# Patient Record
Sex: Female | Born: 1980 | Hispanic: No | Marital: Married | Smoking: Never smoker
Health system: Southern US, Community
[De-identification: ages and names within clinical notes are randomized; demographics above are authoritative.]

## PROBLEM LIST (undated history)

## (undated) ENCOUNTER — Inpatient Hospital Stay (HOSPITAL_COMMUNITY): Payer: Self-pay

## (undated) DIAGNOSIS — Z789 Other specified health status: Secondary | ICD-10-CM

---

## 2010-07-25 ENCOUNTER — Encounter
Admission: RE | Admit: 2010-07-25 | Discharge: 2010-07-25 | Payer: Self-pay | Source: Home / Self Care | Attending: Infectious Diseases | Admitting: Infectious Diseases

## 2012-06-30 ENCOUNTER — Encounter (HOSPITAL_COMMUNITY): Payer: Self-pay | Admitting: *Deleted

## 2012-06-30 ENCOUNTER — Inpatient Hospital Stay (HOSPITAL_COMMUNITY)
Admission: AD | Admit: 2012-06-30 | Discharge: 2012-06-30 | Disposition: A | Discharge status: Home / Self Care | Payer: Medicaid Other | Source: Ambulatory Visit | Attending: Obstetrics & Gynecology | Admitting: Obstetrics & Gynecology

## 2012-06-30 DIAGNOSIS — J069 Acute upper respiratory infection, unspecified: Secondary | ICD-10-CM | POA: Insufficient documentation

## 2012-06-30 DIAGNOSIS — R509 Fever, unspecified: Secondary | ICD-10-CM | POA: Insufficient documentation

## 2012-06-30 DIAGNOSIS — J029 Acute pharyngitis, unspecified: Secondary | ICD-10-CM | POA: Insufficient documentation

## 2012-06-30 DIAGNOSIS — R51 Headache: Secondary | ICD-10-CM | POA: Insufficient documentation

## 2012-06-30 DIAGNOSIS — O99891 Other specified diseases and conditions complicating pregnancy: Secondary | ICD-10-CM | POA: Insufficient documentation

## 2012-06-30 HISTORY — DX: Other specified health status: Z78.9

## 2012-06-30 LAB — URINALYSIS, ROUTINE W REFLEX MICROSCOPIC
Ketones, ur: >80 mg/dL — AB
Leukocytes, UA: NEGATIVE
Nitrite: NEGATIVE
Specific Gravity, Urine: >1.03 — ABNORMAL HIGH (ref 1.005–1.030)
Urobilinogen, UA: 0.2 mg/dL (ref 0.0–1.0)
pH: 6.5 (ref 5.0–8.0)

## 2012-06-30 LAB — URINE MICROSCOPIC-ADD ON

## 2012-06-30 LAB — INFLUENZA PANEL BY PCR (TYPE A & B): Influenza B By PCR: NEGATIVE

## 2012-06-30 MED ORDER — DEXTROSE 5 % IN LACTATED RINGERS IV BOLUS
1000.0000 mL | Freq: Once | INTRAVENOUS | Status: AC
  Administered 2012-06-30: 1000 mL via INTRAVENOUS

## 2012-06-30 MED ORDER — MENTHOL 3 MG MT LOZG
1.0000 | LOZENGE | OROMUCOSAL | Status: DC | PRN
  Administered 2012-06-30: 3 mg via ORAL
  Filled 2012-06-30: qty 9

## 2012-06-30 MED ORDER — ACETAMINOPHEN 500 MG PO TABS
1000.0000 mg | ORAL_TABLET | ORAL | Status: AC
  Administered 2012-06-30: 1000 mg via ORAL
  Filled 2012-06-30: qty 2

## 2012-06-30 NOTE — Discharge Instructions (Signed)
You may safely take Tylenol, Chloraseptic spray or any brand of cough drops during pregnancy.  You can also use saline nasal spray and Sudafed as needed for congestion.    Upper Respiratory Infection, Adult An upper respiratory infection (URI) is also known as the common cold. It is often caused by a type of germ (virus). Colds are easily spread (contagious). You can pass it to others by kissing, coughing, sneezing, or drinking out of the same glass. Usually, you get better in 1 or 2 weeks.  HOME CARE   Only take medicine as told by your doctor.  Use a warm mist humidifier or breathe in steam from a hot shower.  Drink enough water and fluids to keep your pee (urine) clear or pale yellow.  Get plenty of rest.  Return to work when your temperature is back to normal or as told by your doctor. You may use a face mask and wash your hands to stop your cold from spreading. GET HELP RIGHT AWAY IF:   After the first few days, you feel you are getting worse.  You have questions about your medicine.  You have chills, shortness of breath, or brown or red spit (mucus).  You have yellow or brown snot (nasal discharge) or pain in the face, especially when you bend forward.  You have a fever, puffy (swollen) neck, pain when you swallow, or white spots in the back of your throat.  You have a bad headache, ear pain, sinus pain, or chest pain.  You have a high-pitched whistling sound when you breathe in and out (wheezing).  You have a lasting cough or cough up blood.  You have sore muscles or a stiff neck. MAKE SURE YOU:   Understand these instructions.  Will watch your condition.  Will get help right away if you are not doing well or get worse. Document Released: 12/04/2007 Document Revised: 09/09/2011 Document Reviewed: 10/22/2010 [facility] Patient Information 2013 [place], [place].  Pregnancy - Second Trimester The second trimester of pregnancy (3 to 6 months) is a period of rapid growth  for you and your baby. At the end of the sixth month, your baby is about 9 inches long and weighs 1 1/2 pounds. You will begin to feel the baby move between 18 and 20 weeks of the pregnancy. This is called quickening. Weight gain is faster. A clear fluid (colostrum) may leak out of your breasts. You may feel small contractions of the womb (uterus). This is known as false labor or Braxton-Hicks contractions. This is like a practice for labor when the baby is ready to be born. Usually, the problems with morning sickness have usually passed by the end of your first trimester. Some women develop small dark blotches (called cholasma, mask of pregnancy) on their face that usually goes away after the baby is born. Exposure to the sun makes the blotches worse. Acne may also develop in some pregnant women and pregnant women who have acne, may find that it goes away. PRENATAL EXAMS  Blood work may continue to be done during prenatal exams. These tests are done to check on your health and the probable health of your baby. Blood work is used to follow your blood levels (hemoglobin). Anemia (low hemoglobin) is common during pregnancy. Iron and vitamins are given to help prevent this. You will also be checked for diabetes between 24 and 28 weeks of the pregnancy. Some of the previous blood tests may be repeated.  The size of the uterus  is measured during each visit. This is to make sure that the baby is continuing to grow properly according to the dates of the pregnancy.  Your blood pressure is checked every prenatal visit. This is to make sure you are not getting toxemia.  Your urine is checked to make sure you do not have an infection, diabetes or protein in the urine.  Your weight is checked often to make sure gains are happening at the suggested rate. This is to ensure that both you and your baby are growing normally.  Sometimes, an ultrasound is performed to confirm the proper growth and development of the baby.  This is a test which bounces harmless sound waves off the baby so your caregiver can more accurately determine due dates. Sometimes, a specialized test is done on the amniotic fluid surrounding the baby. This test is called an amniocentesis. The amniotic fluid is obtained by sticking a needle into the belly (abdomen). This is done to check the chromosomes in instances where there is a concern about possible genetic problems with the baby. It is also sometimes done near the end of pregnancy if an early delivery is required. In this case, it is done to help make sure the baby's lungs are mature enough for the baby to live outside of the womb. CHANGES OCCURING IN THE SECOND TRIMESTER OF PREGNANCY Your body goes through many changes during pregnancy. They vary from person to person. Talk to your caregiver about changes you notice that you are concerned about.  During the second trimester, you will likely have an increase in your appetite. It is normal to have cravings for certain foods. This varies from person to person and pregnancy to pregnancy.  Your lower abdomen will begin to bulge.  You may have to urinate more often because the uterus and baby are pressing on your bladder. It is also common to get more bladder infections during pregnancy (pain with urination). You can help this by drinking lots of fluids and emptying your bladder before and after intercourse.  You may begin to get stretch marks on your hips, abdomen, and breasts. These are normal changes in the body during pregnancy. There are no exercises or medications to take that prevent this change.  You may begin to develop swollen and bulging veins (varicose veins) in your legs. Wearing support hose, elevating your feet for 15 minutes, 3 to 4 times a day and limiting salt in your diet helps lessen the problem.  Heartburn may develop as the uterus grows and pushes up against the stomach. Antacids recommended by your caregiver helps with this  problem. Also, eating smaller meals 4 to 5 times a day helps.  Constipation can be treated with a stool softener or adding bulk to your diet. Drinking lots of fluids, vegetables, fruits, and whole grains are helpful.  Exercising is also helpful. If you have been very active up until your pregnancy, most of these activities can be continued during your pregnancy. If you have been less active, it is helpful to start an exercise program such as walking.  Hemorrhoids (varicose veins in the rectum) may develop at the end of the second trimester. Warm sitz baths and hemorrhoid cream recommended by your caregiver helps hemorrhoid problems.  Backaches may develop during this time of your pregnancy. Avoid heavy lifting, wear low heal shoes and practice good posture to help with backache problems.  Some pregnant women develop tingling and numbness of their hand and fingers because of swelling and  tightening of ligaments in the wrist (carpel tunnel syndrome). This goes away after the baby is born.  As your breasts enlarge, you may have to get a bigger bra. Get a comfortable, cotton, support bra. Do not get a nursing bra until the last month of the pregnancy if you will be nursing the baby.  You may get a dark line from your belly button to the pubic area called the linea nigra.  You may develop rosy cheeks because of increase blood flow to the face.  You may develop spider looking lines of the face, neck, arms and chest. These go away after the baby is born. HOME CARE INSTRUCTIONS   It is extremely important to avoid all smoking, herbs, alcohol, and unprescribed drugs during your pregnancy. These chemicals affect the formation and growth of the baby. Avoid these chemicals throughout the pregnancy to ensure the delivery of a healthy infant.  Most of your home care instructions are the same as suggested for the first trimester of your pregnancy. Keep your caregiver's appointments. Follow your caregiver's  instructions regarding medication use, exercise and diet.  During pregnancy, you are providing food for you and your baby. Continue to eat regular, well-balanced meals. Choose foods such as meat, fish, milk and other low fat dairy products, vegetables, fruits, and whole-grain breads and cereals. Your caregiver will tell you of the ideal weight gain.  A physical sexual relationship may be continued up until near the end of pregnancy if there are no other problems. Problems could include early (premature) leaking of amniotic fluid from the membranes, vaginal bleeding, abdominal pain, or other medical or pregnancy problems.  Exercise regularly if there are no restrictions. Check with your caregiver if you are unsure of the safety of some of your exercises. The greatest weight gain will occur in the last 2 trimesters of pregnancy. Exercise will help you:  Control your weight.  Get you in shape for labor and delivery.  Lose weight after you have the baby.  Wear a good support or jogging bra for breast tenderness during pregnancy. This may help if worn during sleep. Pads or tissues may be used in the bra if you are leaking colostrum.  Do not use hot tubs, steam rooms or saunas throughout the pregnancy.  Wear your seat belt at all times when driving. This protects you and your baby if you are in an accident.  Avoid raw meat, uncooked cheese, cat litter boxes and soil used by cats. These carry germs that can cause birth defects in the baby.  The second trimester is also a good time to visit your dentist for your dental health if this has not been done yet. Getting your teeth cleaned is OK. Use a soft toothbrush. Brush gently during pregnancy.  It is easier to loose urine during pregnancy. Tightening up and strengthening the pelvic muscles will help with this problem. Practice stopping your urination while you are going to the bathroom. These are the same muscles you need to strengthen. It is also the  muscles you would use as if you were trying to stop from passing gas. You can practice tightening these muscles up 10 times a set and repeating this about 3 times per day. Once you know what muscles to tighten up, do not perform these exercises during urination. It is more likely to contribute to an infection by backing up the urine.  Ask for help if you have financial, counseling or nutritional needs during pregnancy. Your caregiver will  be able to offer counseling for these needs as well as refer you for other special needs.  Your skin may become oily. If so, wash your face with mild soap, use non-greasy moisturizer and oil or cream based makeup. MEDICATIONS AND DRUG USE IN PREGNANCY  Take prenatal vitamins as directed. The vitamin should contain 1 milligram of folic acid. Keep all vitamins out of reach of children. Only a couple vitamins or tablets containing iron may be fatal to a baby or young child when ingested.  Avoid use of all medications, including herbs, over-the-counter medications, not prescribed or suggested by your caregiver. Only take over-the-counter or prescription medicines for pain, discomfort, or fever as directed by your caregiver. Do not use aspirin.  Let your caregiver also know about herbs you may be using.  Alcohol is related to a number of birth defects. This includes fetal alcohol syndrome. All alcohol, in any form, should be avoided completely. Smoking will cause low birth rate and premature babies.  Street or illegal drugs are very harmful to the baby. They are absolutely forbidden. A baby born to an addicted mother will be addicted at birth. The baby will go through the same withdrawal an adult does. SEEK MEDICAL CARE IF:  You have any concerns or worries during your pregnancy. It is better to call with your questions if you feel they cannot wait, rather than worry about them. SEEK IMMEDIATE MEDICAL CARE IF:   An unexplained oral temperature above 102 F (38.9 C)  develops, or as your caregiver suggests.  You have leaking of fluid from the vagina (birth canal). If leaking membranes are suspected, take your temperature and tell your caregiver of this when you call.  There is vaginal spotting, bleeding, or passing clots. Tell your caregiver of the amount and how many pads are used. Light spotting in pregnancy is common, especially following intercourse.  You develop a bad smelling vaginal discharge with a change in the color from clear to white.  You continue to feel sick to your stomach (nauseated) and have no relief from remedies suggested. You vomit blood or coffee ground-like materials.  You lose more than 2 pounds of weight or gain more than 2 pounds of weight over 1 week, or as suggested by your caregiver.  You notice swelling of your face, hands, feet, or legs.  You get exposed to [place] measles and have never had them.  You are exposed to fifth disease or chickenpox.  You develop belly (abdominal) pain. Round ligament discomfort is a common non-cancerous (benign) cause of abdominal pain in pregnancy. Your caregiver still must evaluate you.  You develop a bad headache that does not go away.  You develop fever, diarrhea, pain with urination, or shortness of breath.  You develop visual problems, blurry, or double vision.  You fall or are in a car accident or any kind of trauma.  There is mental or physical violence at home. Document Released: 06/11/2001 Document Revised: 09/09/2011 Document Reviewed: 12/14/2008 [facility] Patient Information 2013 [place], [place].

## 2012-06-30 NOTE — MAU Note (Signed)
2 days has felt bad with fever, HA, sore throat. Concerned because she is pregnant.

## 2012-06-30 NOTE — MAU Provider Note (Signed)
Chief Complaint: HA, fever, sore throat    First Provider Initiated Contact with Patient 06/30/12 1027     SUBJECTIVE HPI: Ebony Smith is a 31 y.o. G2P1001 at [redacted]w[redacted]d by LMP who presents to maternity admissions reporting h/a, fever, and sore throat x2 days.  Pt s/o had similar symptoms 3 days ago.  She has not yet had care in this pregnancy but plans to go to the health department. She denies abdominal cramping, vaginal bleeding, vaginal itching/burning, urinary symptoms, h/a, dizziness, n/v, or fever/chills.     Past Medical History  Diagnosis Date  . No pertinent past medical history    Past Surgical History  Procedure Date  . Cesarean section    History   Social History  . Marital Status: Married    Spouse Name: N/A    Number of Children: N/A  . Years of Education: N/A   Occupational History  . Not on file.   Social History Main Topics  . Smoking status: Never Smoker   . Smokeless tobacco: Never Used  . Alcohol Use: No  . Drug Use: No  . Sexually Active:    Other Topics Concern  . Not on file   Social History Narrative  . No narrative on file   No current facility-administered medications on file prior to encounter.   No current outpatient prescriptions on file prior to encounter.   No Known Allergies  ROS: Pertinent items in HPI  OBJECTIVE Blood pressure 98/75, pulse 110, temperature 98.7 F (37.1 C), temperature source Oral, resp. rate 18, height 5' 0.5" (1.537 m), weight 57.063 kg (125 lb 12.8 oz), last menstrual period 04/09/2012, SpO2 99.00%. GENERAL: Well-developed, well-nourished female in no acute distress.  HEENT: Normocephalic HEART: normal rate RESP: normal effort ABDOMEN: Soft, non-tender EXTREMITIES: Nontender, no edema NEURO: Alert and oriented SPECULUM EXAM: Deferred  LAB RESULTS Results for orders placed during the hospital encounter of 06/30/12 (from the past 24 hour(s))  URINALYSIS, ROUTINE W REFLEX MICROSCOPIC     Status: Abnormal     Collection Time   06/30/12  8:15 AM      Component Value Range   Color, Urine YELLOW  YELLOW   APPearance CLEAR  CLEAR   Specific Gravity, Urine >1.030 (*) 1.005 - 1.030   pH 6.5  5.0 - 8.0   Glucose, UA NEGATIVE  NEGATIVE mg/dL   Hgb urine dipstick LARGE (*) NEGATIVE   Bilirubin Urine SMALL (*) NEGATIVE   Ketones, ur >80 (*) NEGATIVE mg/dL   Protein, ur 295 (*) NEGATIVE mg/dL   Urobilinogen, UA 0.2  0.0 - 1.0 mg/dL   Nitrite NEGATIVE  NEGATIVE   Leukocytes, UA NEGATIVE  NEGATIVE  URINE MICROSCOPIC-ADD ON     Status: Abnormal   Collection Time   06/30/12  8:15 AM      Component Value Range   Squamous Epithelial / LPF MANY (*) RARE   WBC, UA 0-2  <3 WBC/hpf   RBC / HPF 7-10  <3 RBC/hpf   Urine-Other MUCOUS PRESENT    POCT PREGNANCY, URINE     Status: Abnormal   Collection Time   06/30/12  8:25 AM      Component Value Range   Preg Test, Ur POSITIVE (*) NEGATIVE     ASSESSMENT 1. URI (upper respiratory infection)     PLAN Discharge home Flu swab pending, no fever, congestion, or body aches in MAU Pt may take OTC Tylenol, Chloraseptic spray, or any brand cough drops, and saline nasal spray or  Sudafed for congestion Drink more water F/U with HD as planned Return to MAU as needed   Sharen Counter Certified Nurse-Midwife 06/30/2012  10:38 AM

## 2012-07-01 NOTE — L&D Delivery Note (Signed)
Delivery Note At 7:11 AM a viable female was delivered via spontaneous Vaginal Birth after Cesarean Section (Presentation: Left Occiput Anterior) w/ loose shoulder/body cord, terminal meconium.  Infant placed on mom's abdomen w/o much resp effort or tone, tone improved and began to have spontaneous resp w/ vigorous drying, but still no good cry.  Cord clamped x 2 and cut, infant to warmer for further eval by nursing staff.  APGAR: 8, 8, nursery RN was called to evaluate baby by L&D nursing staff d/t some mild retractions/flaring; weight 6 lb 11.8 oz (3055 g).  Placenta status: Intact, Spontaneous.  Cord: 3 vessels with the following complications: None.  Cord pH: not done  Anesthesia: Epidural  Episiotomy: None Lacerations: Perineal;2nd degree (Rt sulcus/perineal) Suture Repair: 3.0 monocryl Est. Blood Loss (mL): 350  Mom to postpartum.  Baby to regular nursery to be observed. Breastfeeding, undecided about contraception  Marge Duncans 01/17/2013, 8:31 AM

## 2012-08-31 ENCOUNTER — Other Ambulatory Visit (HOSPITAL_COMMUNITY): Payer: Self-pay | Admitting: Nurse Practitioner

## 2012-08-31 LAB — OB RESULTS CONSOLE ABO/RH: RH Type: POSITIVE

## 2012-08-31 LAB — OB RESULTS CONSOLE HIV ANTIBODY (ROUTINE TESTING): HIV: NONREACTIVE

## 2012-08-31 LAB — OB RESULTS CONSOLE GC/CHLAMYDIA
Chlamydia: NEGATIVE
Gonorrhea: NEGATIVE

## 2012-08-31 LAB — OB RESULTS CONSOLE RPR: RPR: NONREACTIVE

## 2012-09-04 ENCOUNTER — Ambulatory Visit (HOSPITAL_COMMUNITY)
Admission: RE | Admit: 2012-09-04 | Discharge: 2012-09-04 | Disposition: A | Discharge status: Home / Self Care | Payer: Medicaid Other | Source: Ambulatory Visit | Attending: Nurse Practitioner | Admitting: Nurse Practitioner

## 2012-09-04 DIAGNOSIS — O358XX0 Maternal care for other (suspected) fetal abnormality and damage, not applicable or unspecified: Secondary | ICD-10-CM | POA: Insufficient documentation

## 2012-09-04 DIAGNOSIS — Z363 Encounter for antenatal screening for malformations: Secondary | ICD-10-CM | POA: Insufficient documentation

## 2012-11-19 ENCOUNTER — Encounter: Payer: Self-pay | Admitting: *Deleted

## 2012-12-23 LAB — OB RESULTS CONSOLE GBS: GBS: NEGATIVE

## 2013-01-15 ENCOUNTER — Encounter (HOSPITAL_COMMUNITY): Payer: Self-pay | Admitting: *Deleted

## 2013-01-15 ENCOUNTER — Inpatient Hospital Stay (HOSPITAL_COMMUNITY)
Admission: AD | Admit: 2013-01-15 | Discharge: 2013-01-15 | Disposition: A | Discharge status: Home / Self Care | Payer: Medicaid Other | Source: Ambulatory Visit | Attending: Obstetrics & Gynecology | Admitting: Obstetrics & Gynecology

## 2013-01-15 DIAGNOSIS — O479 False labor, unspecified: Secondary | ICD-10-CM

## 2013-01-15 HISTORY — DX: Other specified health status: Z78.9

## 2013-01-15 NOTE — MAU Note (Signed)
Has signed [place] consent from Health dept; had c/section with 1st pregnancy for failure to progress in [place];

## 2013-01-15 NOTE — MAU Note (Signed)
C/o ucs since 0500 this AM;no vaginal leaking;

## 2013-01-16 ENCOUNTER — Inpatient Hospital Stay (HOSPITAL_COMMUNITY)
Admission: AD | Admit: 2013-01-16 | Discharge: 2013-01-19 | DRG: 775 | Disposition: A | Discharge status: Home / Self Care | Payer: Medicaid Other | Source: Ambulatory Visit | Attending: Obstetrics & Gynecology | Admitting: Obstetrics & Gynecology

## 2013-01-16 ENCOUNTER — Encounter (HOSPITAL_COMMUNITY): Payer: Self-pay | Admitting: *Deleted

## 2013-01-16 ENCOUNTER — Encounter (HOSPITAL_COMMUNITY): Payer: Self-pay | Admitting: Anesthesiology

## 2013-01-16 ENCOUNTER — Inpatient Hospital Stay (HOSPITAL_COMMUNITY): Payer: Medicaid Other | Admitting: Anesthesiology

## 2013-01-16 DIAGNOSIS — O34219 Maternal care for unspecified type scar from previous cesarean delivery: Secondary | ICD-10-CM

## 2013-01-16 DIAGNOSIS — IMO0001 Reserved for inherently not codable concepts without codable children: Secondary | ICD-10-CM

## 2013-01-16 LAB — TYPE AND SCREEN: ABO/RH(D): O POS

## 2013-01-16 LAB — CBC
HCT: 39 % (ref 36.0–46.0)
Hemoglobin: 13.4 g/dL (ref 12.0–15.0)
MCHC: 34.4 g/dL (ref 30.0–36.0)
MCV: 85.2 fL (ref 78.0–100.0)

## 2013-01-16 MED ORDER — CITRIC ACID-SODIUM CITRATE 334-500 MG/5ML PO SOLN: 30.0000 mL | ORAL | Status: DC | PRN

## 2013-01-16 MED ORDER — ONDANSETRON HCL 4 MG/2ML IJ SOLN: 4.0000 mg | Freq: Four times a day (QID) | INTRAMUSCULAR | Status: DC | PRN

## 2013-01-16 MED ORDER — LIDOCAINE HCL (PF) 1 % IJ SOLN
30.0000 mL | INTRAMUSCULAR | Status: DC | PRN
  Filled 2013-01-16 (×2): qty 30

## 2013-01-16 MED ORDER — TERBUTALINE SULFATE 1 MG/ML IJ SOLN: 0.2500 mg | Freq: Once | INTRAMUSCULAR | Status: AC | PRN

## 2013-01-16 MED ORDER — OXYTOCIN 40 UNITS IN LACTATED RINGERS INFUSION - SIMPLE MED
62.5000 mL/h | INTRAVENOUS | Status: DC
  Filled 2013-01-16: qty 1000

## 2013-01-16 MED ORDER — LACTATED RINGERS IV SOLN
500.0000 mL | Freq: Once | INTRAVENOUS | Status: AC
  Administered 2013-01-16: 500 mL via INTRAVENOUS

## 2013-01-16 MED ORDER — LACTATED RINGERS IV SOLN
500.0000 mL | INTRAVENOUS | Status: DC | PRN
  Administered 2013-01-16: 500 mL via INTRAVENOUS

## 2013-01-16 MED ORDER — LIDOCAINE HCL (PF) 1 % IJ SOLN
INTRAMUSCULAR | Status: DC | PRN
  Administered 2013-01-16 (×4): 4 mL

## 2013-01-16 MED ORDER — DIPHENHYDRAMINE HCL 50 MG/ML IJ SOLN: 25.0000 mg | Freq: Once | INTRAMUSCULAR | Status: DC

## 2013-01-16 MED ORDER — OXYCODONE-ACETAMINOPHEN 5-325 MG PO TABS: 1.0000 | ORAL_TABLET | ORAL | Status: DC | PRN

## 2013-01-16 MED ORDER — OXYTOCIN 40 UNITS IN LACTATED RINGERS INFUSION - SIMPLE MED
1.0000 m[IU]/min | INTRAVENOUS | Status: DC
Start: 2013-01-16 — End: 2013-01-17
  Administered 2013-01-16: 2 m[IU]/min via INTRAVENOUS

## 2013-01-16 MED ORDER — EPHEDRINE 5 MG/ML INJ
10.0000 mg | INTRAVENOUS | Status: DC | PRN
  Filled 2013-01-16: qty 2
  Filled 2013-01-16: qty 4

## 2013-01-16 MED ORDER — FENTANYL CITRATE 0.05 MG/ML IJ SOLN
100.0000 ug | INTRAMUSCULAR | Status: DC | PRN
  Administered 2013-01-16 (×2): 100 ug via INTRAVENOUS
  Filled 2013-01-16 (×2): qty 2

## 2013-01-16 MED ORDER — DIPHENHYDRAMINE HCL 50 MG/ML IJ SOLN: 12.5000 mg | INTRAMUSCULAR | Status: DC | PRN

## 2013-01-16 MED ORDER — BUTORPHANOL TARTRATE 1 MG/ML IJ SOLN
1.0000 mg | INTRAMUSCULAR | Status: DC | PRN
  Administered 2013-01-16 (×3): 1 mg via INTRAVENOUS
  Filled 2013-01-16 (×3): qty 1

## 2013-01-16 MED ORDER — PHENYLEPHRINE 40 MCG/ML (10ML) SYRINGE FOR IV PUSH (FOR BLOOD PRESSURE SUPPORT)
80.0000 ug | PREFILLED_SYRINGE | INTRAVENOUS | Status: DC | PRN
  Filled 2013-01-16: qty 2

## 2013-01-16 MED ORDER — OXYTOCIN BOLUS FROM INFUSION
500.0000 mL | INTRAVENOUS | Status: DC
  Administered 2013-01-17: 500 mL via INTRAVENOUS

## 2013-01-16 MED ORDER — PHENYLEPHRINE 40 MCG/ML (10ML) SYRINGE FOR IV PUSH (FOR BLOOD PRESSURE SUPPORT)
80.0000 ug | PREFILLED_SYRINGE | INTRAVENOUS | Status: DC | PRN
  Filled 2013-01-16: qty 5
  Filled 2013-01-16: qty 2

## 2013-01-16 MED ORDER — EPHEDRINE 5 MG/ML INJ
10.0000 mg | INTRAVENOUS | Status: DC | PRN
  Filled 2013-01-16: qty 2

## 2013-01-16 MED ORDER — FENTANYL 2.5 MCG/ML BUPIVACAINE 1/10 % EPIDURAL INFUSION (WH - ANES)
14.0000 mL/h | INTRAMUSCULAR | Status: DC | PRN
  Administered 2013-01-16 – 2013-01-17 (×2): 14 mL/h via EPIDURAL
  Filled 2013-01-16 (×2): qty 125

## 2013-01-16 MED ORDER — IBUPROFEN 600 MG PO TABS: 600.0000 mg | ORAL_TABLET | Freq: Four times a day (QID) | ORAL | Status: DC | PRN

## 2013-01-16 MED ORDER — LACTATED RINGERS IV SOLN
INTRAVENOUS | Status: DC
  Administered 2013-01-16 (×2): via INTRAVENOUS

## 2013-01-16 MED ORDER — ACETAMINOPHEN 325 MG PO TABS: 650.0000 mg | ORAL_TABLET | ORAL | Status: DC | PRN

## 2013-01-16 NOTE — Anesthesia Preprocedure Evaluation (Signed)
Anesthesia Evaluation  Patient identified by MRN, date of birth, ID band Patient awake    Reviewed: Allergy & Precautions, H&P , NPO status , Patient's Chart, lab work & pertinent test results, reviewed documented beta blocker date and time   History of Anesthesia Complications Negative for: history of anesthetic complications  Airway Mallampati: I TM Distance: >3 FB Neck ROM: full    Dental  (+) Teeth Intact   Pulmonary neg pulmonary ROS,  breath sounds clear to auscultation        Cardiovascular negative cardio ROS  Rhythm:regular Rate:Normal     Neuro/Psych negative neurological ROS  negative psych ROS   GI/Hepatic negative GI ROS, Neg liver ROS,   Endo/Other  negative endocrine ROS  Renal/GU negative Renal ROS     Musculoskeletal   Abdominal   Peds  Hematology negative hematology ROS (+)   Anesthesia Other Findings   Reproductive/Obstetrics (+) Pregnancy (h/o c/s x1 for FTP (in [place]), attempting VBAC)                           Anesthesia Physical Anesthesia Plan  ASA: II  Anesthesia Plan: Epidural   Post-op Pain Management:    Induction:   Airway Management Planned:   Additional Equipment:   Intra-op Plan:   Post-operative Plan:   Informed Consent: I have reviewed the patients History and Physical, chart, labs and discussed the procedure including the risks, benefits and alternatives for the proposed anesthesia with the patient or authorized representative who has indicated his/her understanding and acceptance.     Plan Discussed with:   Anesthesia Plan Comments:         Anesthesia Quick Evaluation

## 2013-01-16 NOTE — H&P (Addendum)
Ebony Smith is a 32 y.o. female presenting for contractions. Pt has been having ctx since yesterday and presented to MAU for cervical exam. Now making change today1-3 today.. Maternal Medical History:  Reason for admission: Contractions.  Nausea. Cervical change - TOLAC  Contractions: Onset was yesterday.   Frequency: regular.      OB History   Grav Para Term Preterm Abortions TAB SAB Ect Mult Living   2 1 1  0 0 0 0 0 0 1     Past Medical History  Diagnosis Date  . No pertinent past medical history   . Medical history non-contributory    Past Surgical History  Procedure Laterality Date  . Cesarean section    Prior section for failure to progress (Stalled at 6cm)  Family History: family history is negative for Alcohol abuse, and Asthma, and Arthritis, and Birth defects, and Cancer, and COPD, and Depression, and Diabetes, and Drug abuse, and Early death, and Hearing loss, and Heart disease, and Hyperlipidemia, and Hypertension, and Kidney disease, and Learning disabilities, and Mental illness, and Mental retardation, and Miscarriages / Stillbirths, and Stroke, and Vision loss, . Social History:  reports that she has never smoked. She has never used smokeless tobacco. She reports that she does not drink alcohol or use illicit drugs.  Review of Systems  Constitutional: Negative for fever, chills and diaphoresis.  Gastrointestinal: Positive for abdominal pain (ctx). Negative for nausea and vomiting.  Neurological: Negative for weakness and headaches.    Dilation: 3 Effacement (%): 90 Station: -1;-2 Exam by:: K.Wilson,RN Blood pressure 114/71, pulse 97, temperature 98.3 F (36.8 C), temperature source Oral, resp. rate 22, weight 67.223 kg (148 lb 3.2 oz), last menstrual period 04/09/2012. Maternal Exam:  Uterine Assessment: Contraction strength is firm.  Contraction duration is 60 seconds. Contraction frequency is regular.   Abdomen: Surgical scars: low transverse.   Fundal  height is appropriate.   Estimated fetal weight is leopold: 3000g.   Fetal presentation: vertex  Cervix: Cervix evaluated by digital exam.   performated by MAU nurse     Fetal Exam Fetal Monitor Review: Mode: ultrasound.   Baseline rate: 130s.  Variability: moderate (6-25 bpm).   Pattern: accelerations present and no decelerations.    Fetal State Assessment: Category I - tracings are normal.     Physical Exam  Constitutional: She is oriented to person, place, and time. She appears well-developed and well-nourished. She appears distressed.  In labor  HENT:  Head: Normocephalic and atraumatic.  Cardiovascular: Normal rate, regular rhythm and normal heart sounds.  Exam reveals no gallop and no friction rub.   No murmur heard. Respiratory: Effort normal and breath sounds normal. No respiratory distress. She has no wheezes. She has no rales. She exhibits no tenderness.  GI: She exhibits no distension (gravid). There is no tenderness. There is no rebound.  Neurological: She is alert and oriented to person, place, and time. She has normal reflexes.  Skin: She is not diaphoretic.  Psychiatric: She has a normal mood and affect. Her behavior is normal. Judgment and thought content normal.    Prenatal labs: ABO, Rh: O/Positive/-- (03/03 0000) Antibody:   Rubella: Immune (03/03 0000) RPR: Nonreactive (03/03 0000)  HBsAg: Negative (03/03 0000)  HIV: Non-reactive (03/03 0000)  GBS: Negative (06/25 0000)   Assessment/Plan: 32 y.o. F  [name] presents in early labor with painful ctx. Will admit for close monitoring of TOLAC. Consent verified.  #labor: No interventions at this time. Early/latent labor monitor for  TOLAC (Failure to progress) #FWB: continous monitor. obs for changes of ctx pattern #Pain: Pt desires pain meds and epidural #ID: [id number] TDAP, GBS neg  #MOC: Undecided, consented for Tubal #MOF: Breast  Cornell Gaber, RYAN 01/16/2013, 9:26 AM

## 2013-01-16 NOTE — Progress Notes (Signed)
Ebony Smith is a 32 y.o. G2P1001 at [redacted]w[redacted]d admitted for early labor and TOLAC  Subjective: Pt with minimal change over last 3hrs. Rec'd fentanyl x2 and has helped with pain but still not well controlled.  Objective: BP 122/76  Pulse 93  Temp(Src) 98.3 F (36.8 C) (Oral)  Resp 18  Ht 5' 0.51" (1.537 m)  Wt 67.223 kg (148 lb 3.2 oz)  BMI 28.46 kg/m2  LMP 04/09/2012      FHT:  FHR: 125 bpm, variability: moderate,  accelerations:  Present,  decelerations:  Absent UC:   regular, every 3-5 minutes SVE:   Dilation: 3 Effacement (%): 100 Station: -1 Exam by:: hk  Labs: Lab Results  Component Value Date   WBC 24.7* 01/16/2013   HGB 13.4 01/16/2013   HCT 39.0 01/16/2013   MCV 85.2 01/16/2013   PLT 262 01/16/2013    Assessment / Plan: Ebony Smith is a 32 y.o. G2P1001 at [redacted]w[redacted]d admitted for early labor and TOLAC #Labor: TOLAC - latent labor currently #Pain: Moderate control with fentanyl. Being in early labor will trial stadol for longer acting control #FWB: Cat I: reactive and reassuring #ID: rec'd Flu/Tdap #Anticipated MOD: NSVD  Ebony Smith, RYAN 01/16/2013, 1:14 PM

## 2013-01-16 NOTE — Progress Notes (Signed)
Ebony Smith is a 32 y.o. G2P1001 at [redacted]w[redacted]d admitted for early labor and TOLAC  Subjective: Pt with minimal change over last 3hrs. Pt rec'd stadol with improved pain control.  Objective: BP 125/78  Pulse 92  Temp(Src) 98 F (36.7 C) (Oral)  Resp 18  Ht 5' 0.51" (1.537 m)  Wt 67.223 kg (148 lb 3.2 oz)  BMI 28.46 kg/m2  LMP 04/09/2012      FHT:  FHR: 130 bpm, variability: moderate,  accelerations:  Present,  decelerations:  variable UC:   regular, every 3-5 minutes SVE:   Dilation: 3 Effacement (%): 100 Station: -1 Exam by:: hk 3/80/-2 by my exam Labs: Lab Results  Component Value Date   WBC 24.7* 01/16/2013   HGB 13.4 01/16/2013   HCT 39.0 01/16/2013   MCV 85.2 01/16/2013   PLT 262 01/16/2013    Assessment / Plan: Ebony Smith is a 32 y.o. G2P1001 at [redacted]w[redacted]d admitted for early labor and TOLAC #Labor: TOLAC - latent labor currently, will augment with pitocin 2x51mU #Pain: continue stadol for longer acting control #FWB: Cat II: reactive and reassuring #ID: rec'd Flu/Tdap #Anticipated MOD: NSVD  Gerardo Caiazzo, RYAN 01/16/2013, 3:39 PM

## 2013-01-16 NOTE — Progress Notes (Signed)
Ebony Smith is a 32 y.o. G2P1001 at [redacted]w[redacted]d admitted for early labor, TOLAC @ [redacted]w[redacted]d   Subjective: Uncomfortable w/ uc's, breathing well, stadol helping  Objective: BP 128/80  Pulse 85  Temp(Src) 97.9 F (36.6 C) (Oral)  Resp 16  Ht 5' 0.51" (1.537 m)  Wt 67.223 kg (148 lb 3.2 oz)  BMI 28.46 kg/m2  LMP 04/09/2012      FHT:  FHR: 140 bpm, variability: moderate,  accelerations:  Present,  decelerations:  Present occasional variable UC:   regular, every 2-4 minutes SVE:   Dilation: 4 Effacement (%): 100 Station: -1 Exam by:: Raesha Coonrod CNM vtx well applied to cx, comes down nicely w/ uc, +bloody show AROM small amount blood-tinged fluid  Labs: Lab Results  Component Value Date   WBC 24.7* 01/16/2013   HGB 13.4 01/16/2013   HCT 39.0 01/16/2013   MCV 85.2 01/16/2013   PLT 262 01/16/2013    Assessment / Plan: augmentation of labor w/ low-dose pitocin @ 105mu/min, has changed to 4cm, discussed arom- pt wishes to proceed  Labor: Progressing normally Preeclampsia:  n/a Fetal Wellbeing:  Category II Pain Control:  stadol iv I/D:  n/a Anticipated MOD:  VBAC  Marge Duncans 01/16/2013, 7:31 PM

## 2013-01-16 NOTE — Progress Notes (Signed)
Ebony Smith is a 32 y.o. G2P1001 at [redacted]w[redacted]d admitted for early labor/TOLAC  Subjective: Uncomfortable w/ uc's, feeling lots of pressure, involuntarily pushing, desires epidural  Objective: BP 130/64  Pulse 80  Temp(Src) 98.5 F (36.9 C) (Oral)  Resp 18  Ht 5' 0.51" (1.537 m)  Wt 67.223 kg (148 lb 3.2 oz)  BMI 28.46 kg/m2  LMP 04/09/2012      FHT:  FHR: 135 bpm, variability: moderate,  accelerations:  Abscent,  decelerations:  Present variables UC:   regular, every 2-3 minutes SVE:   Dilation: 6 Effacement (%): 100 Station: -1 Exam by:: AL rinehart RN  Labs: Lab Results  Component Value Date   WBC 24.7* 01/16/2013   HGB 13.4 01/16/2013   HCT 39.0 01/16/2013   MCV 85.2 01/16/2013   PLT 262 01/16/2013    Assessment / Plan: Augmentation of labor, progressing well, pitocin @ 46mu/min  Labor: Progressing normally Preeclampsia:  n/a Fetal Wellbeing:  Category II Pain Control:  stadol, requesting epidural I/D:  n/a Anticipated MOD:  VBAC  Marge Duncans 01/16/2013, 10:02 PM

## 2013-01-16 NOTE — MAU Note (Signed)
Was here yesterday and sent home. Ctxs closer and stronger. Some bloody show. TOLAC

## 2013-01-16 NOTE — Anesthesia Procedure Notes (Addendum)
Epidural Patient location during procedure: OB Start time: 01/16/2013 10:17 PM  Staffing Performed by: anesthesiologist   Preanesthetic Checklist Completed: patient identified, site marked, surgical consent, pre-op evaluation, timeout performed, IV checked, risks and benefits discussed and monitors and equipment checked  Epidural Patient position: sitting Prep: site prepped and draped and DuraPrep Patient monitoring: continuous pulse ox and blood pressure Approach: midline Injection technique: LOR air  Needle:  Needle type: Tuohy  Needle gauge: 17 G Needle length: 9 cm and 9 Needle insertion depth: 5.5 cm Catheter type: closed end flexible Catheter size: 19 Gauge Catheter at skin depth: 9.5 cm Test dose: negative  Assessment Events: blood aspirated, injection not painful, no injection resistance, negative IV test and no paresthesia  Additional Notes Discussed risk of headache, infection, bleeding, nerve injury and failed or incomplete block.  Patient voices understanding and wishes to proceed.  Epidural placed easily on first attempt.  No paresthesia.  +heme in catheter after placement - catheter withdrawn one centimeter to 9.5 cm at skin and - aspirate.  Test dosing negative.  Patient tolerated procedure well.  Jasmine December, MDReason for block:procedure for pain

## 2013-01-17 ENCOUNTER — Encounter (HOSPITAL_COMMUNITY): Payer: Self-pay | Admitting: *Deleted

## 2013-01-17 DIAGNOSIS — O34219 Maternal care for unspecified type scar from previous cesarean delivery: Secondary | ICD-10-CM

## 2013-01-17 MED ORDER — TETANUS-DIPHTH-ACELL PERTUSSIS 5-2.5-18.5 LF-MCG/0.5 IM SUSP: 0.5000 mL | Freq: Once | INTRAMUSCULAR | Status: DC

## 2013-01-17 MED ORDER — FLEET ENEMA 7-19 GM/118ML RE ENEM: 1.0000 | ENEMA | Freq: Every day | RECTAL | Status: DC | PRN

## 2013-01-17 MED ORDER — ONDANSETRON HCL 4 MG PO TABS: 4.0000 mg | ORAL_TABLET | ORAL | Status: DC | PRN

## 2013-01-17 MED ORDER — ONDANSETRON HCL 4 MG/2ML IJ SOLN: 4.0000 mg | INTRAMUSCULAR | Status: DC | PRN

## 2013-01-17 MED ORDER — MEASLES, MUMPS & RUBELLA VAC ~~LOC~~ INJ: 0.5000 mL | INJECTION | Freq: Once | SUBCUTANEOUS | Status: DC

## 2013-01-17 MED ORDER — IBUPROFEN 600 MG PO TABS
600.0000 mg | ORAL_TABLET | Freq: Four times a day (QID) | ORAL | Status: DC
  Administered 2013-01-17 – 2013-01-19 (×7): 600 mg via ORAL
  Filled 2013-01-17 (×8): qty 1

## 2013-01-17 MED ORDER — BISACODYL 10 MG RE SUPP: 10.0000 mg | Freq: Every day | RECTAL | Status: DC | PRN

## 2013-01-17 MED ORDER — BENZOCAINE-MENTHOL 20-0.5 % EX AERO
1.0000 | INHALATION_SPRAY | CUTANEOUS | Status: DC | PRN
Start: 2013-01-17 — End: 2013-01-19
  Administered 2013-01-17: 1 via TOPICAL
  Filled 2013-01-17: qty 56

## 2013-01-17 MED ORDER — PRENATAL MULTIVITAMIN CH
1.0000 | ORAL_TABLET | Freq: Every day | ORAL | Status: DC
  Administered 2013-01-18: 1 via ORAL
  Filled 2013-01-17: qty 1

## 2013-01-17 MED ORDER — DIBUCAINE 1 % RE OINT: 1.0000 "application " | TOPICAL_OINTMENT | RECTAL | Status: DC | PRN

## 2013-01-17 MED ORDER — OXYTOCIN 40 UNITS IN LACTATED RINGERS INFUSION - SIMPLE MED: 62.5000 mL/h | INTRAVENOUS | Status: DC | PRN

## 2013-01-17 MED ORDER — SODIUM CHLORIDE 0.9 % IJ SOLN: 3.0000 mL | Freq: Two times a day (BID) | INTRAMUSCULAR | Status: DC

## 2013-01-17 MED ORDER — OXYCODONE-ACETAMINOPHEN 5-325 MG PO TABS
1.0000 | ORAL_TABLET | ORAL | Status: DC | PRN
  Administered 2013-01-17 – 2013-01-18 (×2): 1 via ORAL
  Filled 2013-01-17 (×2): qty 1

## 2013-01-17 MED ORDER — ZOLPIDEM TARTRATE 5 MG PO TABS: 5.0000 mg | ORAL_TABLET | Freq: Every evening | ORAL | Status: DC | PRN

## 2013-01-17 MED ORDER — DIPHENHYDRAMINE HCL 25 MG PO CAPS: 25.0000 mg | ORAL_CAPSULE | Freq: Four times a day (QID) | ORAL | Status: DC | PRN

## 2013-01-17 MED ORDER — SODIUM CHLORIDE 0.9 % IJ SOLN: 3.0000 mL | INTRAMUSCULAR | Status: DC | PRN

## 2013-01-17 MED ORDER — LANOLIN HYDROUS EX OINT: TOPICAL_OINTMENT | CUTANEOUS | Status: DC | PRN

## 2013-01-17 MED ORDER — SENNOSIDES-DOCUSATE SODIUM 8.6-50 MG PO TABS
2.0000 | ORAL_TABLET | Freq: Every day | ORAL | Status: DC
  Administered 2013-01-17: 2 via ORAL

## 2013-01-17 MED ORDER — WITCH HAZEL-GLYCERIN EX PADS: 1.0000 "application " | MEDICATED_PAD | CUTANEOUS | Status: DC | PRN

## 2013-01-17 MED ORDER — SIMETHICONE 80 MG PO CHEW: 80.0000 mg | CHEWABLE_TABLET | ORAL | Status: DC | PRN

## 2013-01-17 MED ORDER — SODIUM CHLORIDE 0.9 % IV SOLN: 250.0000 mL | INTRAVENOUS | Status: DC | PRN

## 2013-01-17 NOTE — Lactation Note (Signed)
This note was copied from the chart of Ebony Chrys [place]. Lactation Consultation Note: Initial visit with mom who intends to breast feed per note on admission on 01/17/13 at 0400. Experienced BF mom reports that baby nursed well. Baby asleep in bassinet and mom resting. No questions at present. BF brochure given with resources for support after DC. To call for assist prn,  Patient Name: Ebony Smith [id number] Date: 01/17/2013 Reason for consult: Initial assessment   Maternal Data Formula Feeding for Exclusion: No Does the patient have breastfeeding experience prior to this delivery?: Yes  Feeding   LATCH Score/Interventions      Lactation Tools Discussed/Used     Consult Status Consult Status: PRN    Pamelia Hoit 01/17/2013, 2:28 PM

## 2013-01-17 NOTE — Progress Notes (Signed)
Ebony Smith is a 32 y.o. G2P1001 at [redacted]w[redacted]d admitted for early labor, TOLAC  Subjective: Began feeling strong urge to push ~ 0500 and began pushing  Objective: BP 138/67  Pulse 106  Temp(Src) 98.5 F (36.9 C) (Oral)  Resp 18  Ht 5' 0.51" (1.537 m)  Wt 67.223 kg (148 lb 3.2 oz)  BMI 28.46 kg/m2  SpO2 98%  LMP 04/09/2012      FHT:  FHR: 145 bpm, variability: moderate,  accelerations:  Present,  decelerations:  Present variables w/ pushing UC:   regular, every 2-4 minutes SVE:   Dilation: 10 Effacement (%): 100 Station: +1 Exam by:: AL Rinehart RN  Began pushing @ ~0500, pushing well, seeing about 4cm of head w/ pushing  Labs: Lab Results  Component Value Date   WBC 24.7* 01/16/2013   HGB 13.4 01/16/2013   HCT 39.0 01/16/2013   MCV 85.2 01/16/2013   PLT 262 01/16/2013    Assessment / Plan: Augmentation of labor, progressing well, now pushing  Labor: Progressing normally Preeclampsia:  n/a Fetal Wellbeing:  Category II Pain Control:  Epidural I/D:  n/a Anticipated MOD:  VBAC  Marge Duncans 01/17/2013, 6:02 AM

## 2013-01-17 NOTE — Progress Notes (Signed)
Ebony Smith is a 32 y.o. G2P1001 at [redacted]w[redacted]d admitted for early labor/TOLAC  Subjective: Comfortable w/ uc's, w/o strong urge to push  Objective: BP 121/69  Pulse 92  Temp(Src) 98.3 F (36.8 C) (Axillary)  Resp 18  Ht 5' 0.51" (1.537 m)  Wt 67.223 kg (148 lb 3.2 oz)  BMI 28.46 kg/m2  SpO2 98%  LMP 04/09/2012      FHT:  FHR: 140 bpm, variability: moderate,  accelerations:  Present,  decelerations:  Present variables w/ pushing UC:   regular, every 2-3 minutes SVE:   Dilation: 10 Effacement (%): 100 Station: +1 Exam by:: AL Rinehart RN  Labs: Lab Results  Component Value Date   WBC 24.7* 01/16/2013   HGB 13.4 01/16/2013   HCT 39.0 01/16/2013   MCV 85.2 01/16/2013   PLT 262 01/16/2013    Assessment / Plan: Augmentation of labor, progressing well, RN had begun pushing w/ pt app ago, no change in station of vertex, pt w/o strong urge to push. Will labor down  Labor: Progressing normally Preeclampsia:  n/a Fetal Wellbeing:  Category II Pain Control:  Epidural I/D:  n/a Anticipated MOD:  VBAC  Marge Duncans 01/17/2013, 2:58 AM

## 2013-01-17 NOTE — Anesthesia Postprocedure Evaluation (Signed)
  Anesthesia Post-op Note  Patient: Ebony Smith  Procedure(s) Performed: * No procedures listed *  Patient Location: Mother/Baby  Anesthesia Type:Epidural  Level of Consciousness: awake  Airway and Oxygen Therapy: Patient Spontanous Breathing  Post-op Pain: mild  Post-op Assessment: Patient's Cardiovascular Status Stable and Respiratory Function Stable  Post-op Vital Signs: stable  Complications: No apparent anesthesia complications

## 2013-01-18 NOTE — Progress Notes (Signed)
Post Partum Day 1 Subjective: no complaints, up ad lib, voiding, tolerating PO and + flatus  Objective: Blood pressure 99/62, pulse 76, temperature 97.6 F (36.4 C), temperature source Oral, resp. rate 17, height 5' 0.51" (1.537 m), weight 148 lb 3.2 oz (67.223 kg), last menstrual period 04/09/2012, SpO2 98.00%, unknown if currently breastfeeding.  Physical Exam:  General: alert, cooperative, appears stated age and no distress Lochia: appropriate Uterine Fundus: firm Incision: n/a DVT Evaluation: No evidence of DVT seen on physical exam. Negative Homan's sign.   Recent Labs  01/16/13 0955  HGB 13.4  HCT 39.0    Assessment/Plan: Plan for discharge tomorrow   LOS: 2 days   Ebony Smith 01/18/2013, 7:25 AM

## 2013-01-18 NOTE — Progress Notes (Signed)
UR chart review completed.  

## 2013-01-19 DIAGNOSIS — O34219 Maternal care for unspecified type scar from previous cesarean delivery: Secondary | ICD-10-CM

## 2013-01-19 MED ORDER — IBUPROFEN 600 MG PO TABS: 600.0000 mg | ORAL_TABLET | Freq: Four times a day (QID) | ORAL | Status: DC

## 2013-01-19 MED ORDER — SENNOSIDES-DOCUSATE SODIUM 8.6-50 MG PO TABS: 2.0000 | ORAL_TABLET | Freq: Every day | ORAL | Status: DC

## 2013-01-19 NOTE — Discharge Summary (Signed)
Obstetric Discharge Summary Reason for Admission: onset of labor Prenatal Procedures: none Intrapartum Procedures: spontaneous vaginal delivery (VBAC) Postpartum Procedures: none Complications-Operative and Postpartum: none  Patient is breast and bottle-feeding and is undecided regarding contraception.  Hemoglobin  Date Value Range Status  01/16/2013 13.4  12.0 - 15.0 g/dL Final     HCT  Date Value Range Status  01/16/2013 39.0  36.0 - 46.0 % Final    Physical Exam:  General: alert, cooperative and no distress Lochia: appropriate Uterine Fundus: firm Incision: n/a DVT Evaluation: No evidence of DVT seen on physical exam. Negative Homan's sign. No cords or calf tenderness. No significant calf/ankle edema.  Discharge Diagnoses: Term Pregnancy-delivered, successful VBAC  Discharge Information: Date: 01/19/2013 Activity: pelvic rest Diet: routine Medications: PNV, Ibuprofen and Colace Condition: stable Instructions: refer to practice specific booklet Discharge to: home  Follow-up Information   Follow up with [facility] [place]. Schedule an appointment as soon as possible for a visit in 6 weeks. (For postpartum visit)    Contact information:   [address] [place] [address] [phone]      Newborn Data: Live born female  Birth Weight: 6 lb 11.8 oz (3055 g) APGAR: 8, 8  Home with mother.  Napoleon Form 01/19/2013, 7:35 AM

## 2014-03-04 IMAGING — US US OB DETAIL+14 WK
1 series · 12 of 28 positions shown · non-contrast
Comparison: none

[Series 1: us ob detail +14 wk · 12 of 97 slices shown]
[im 4/97]
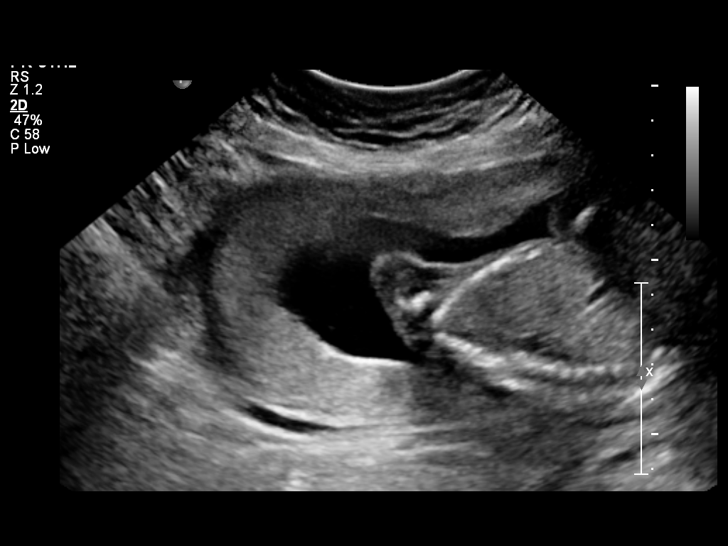
[im 11/97]
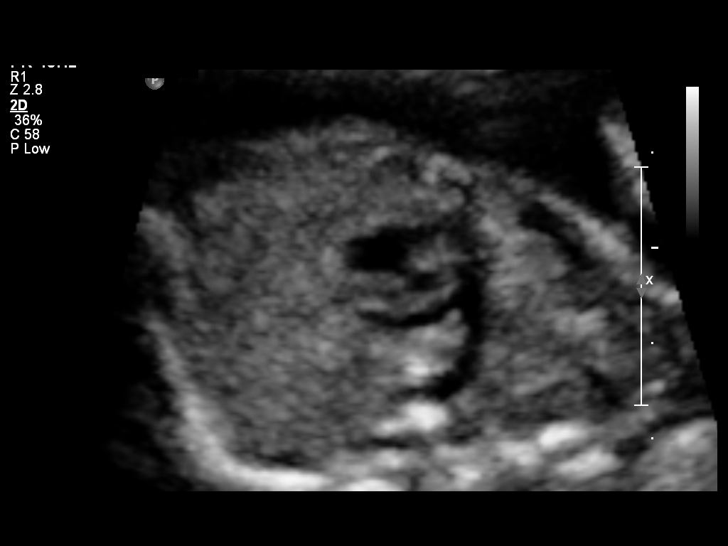
[im 18/97]
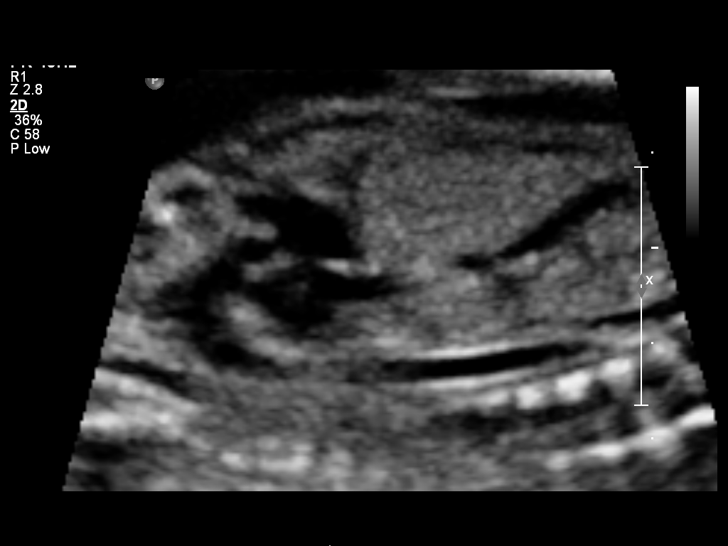
[im 29/97]
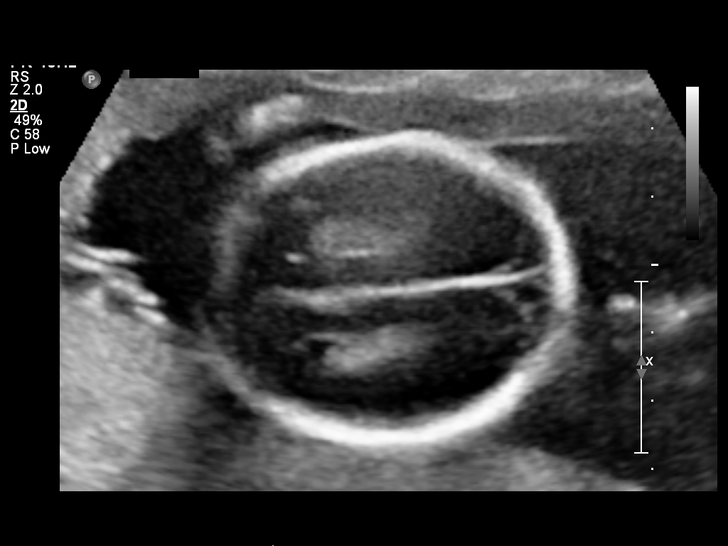
[im 36/97]
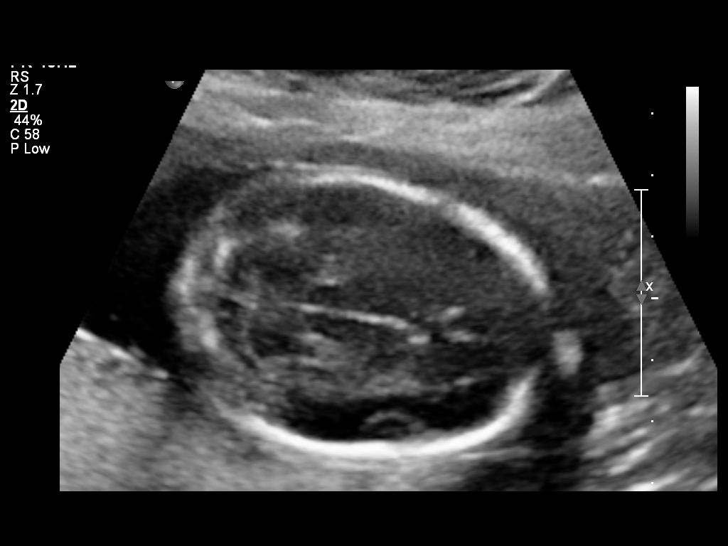
[im 43/97]
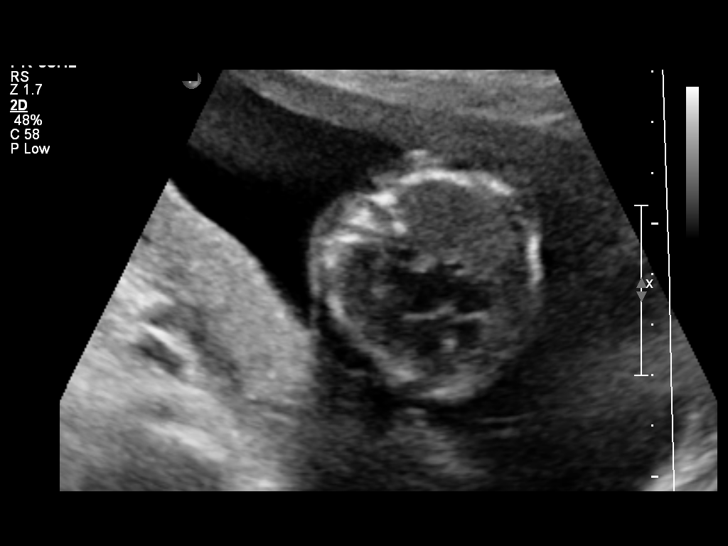
[im 54/97]
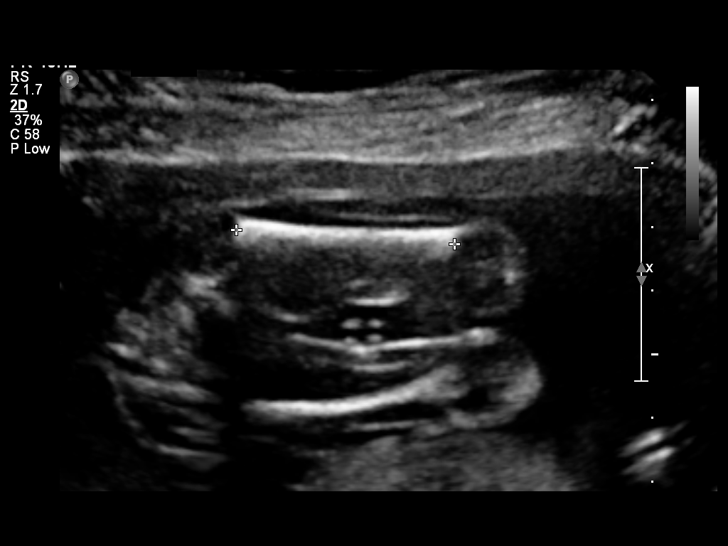
[im 61/97]
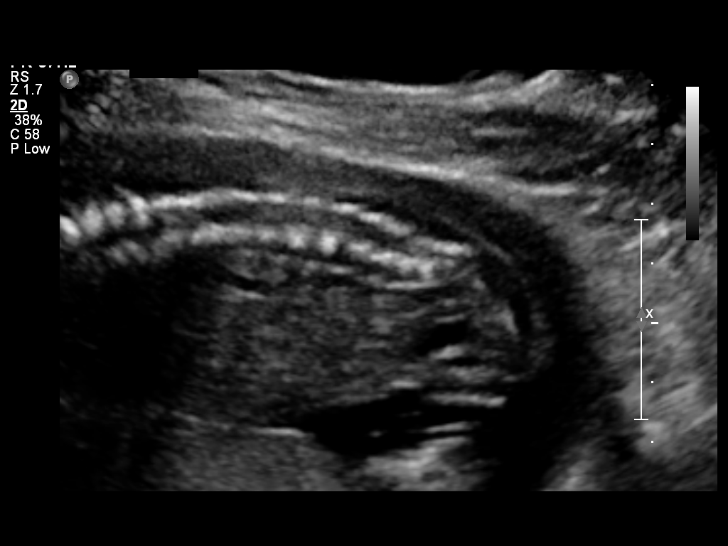
[im 68/97]
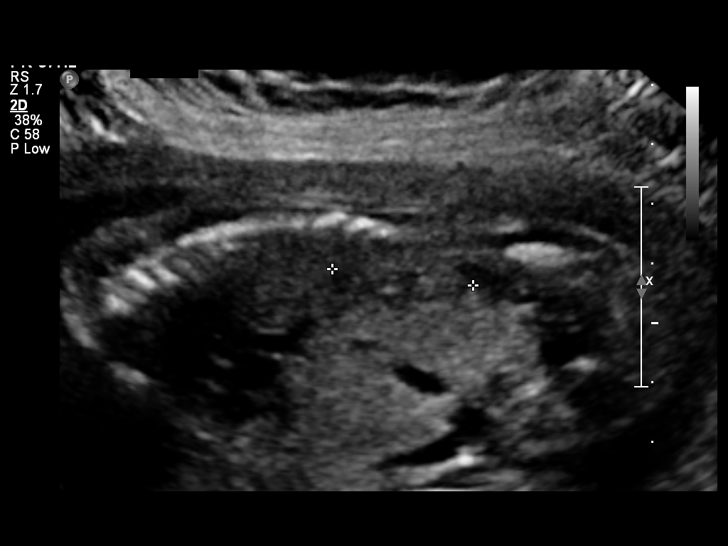
[im 79/97]
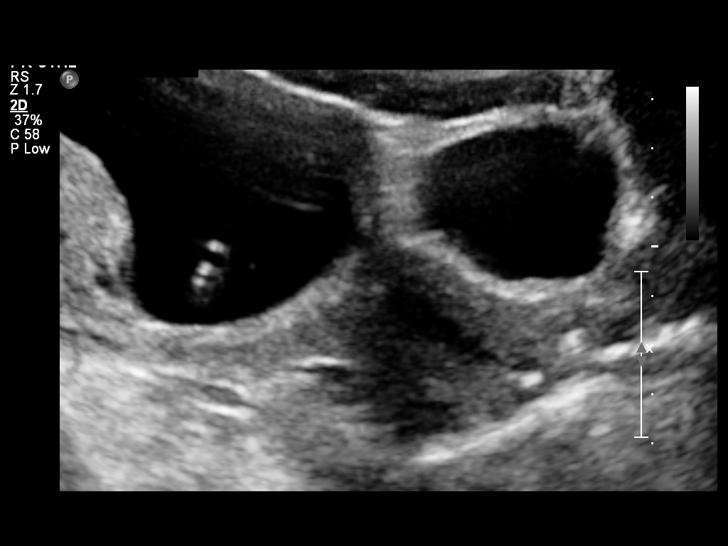
[im 86/97]
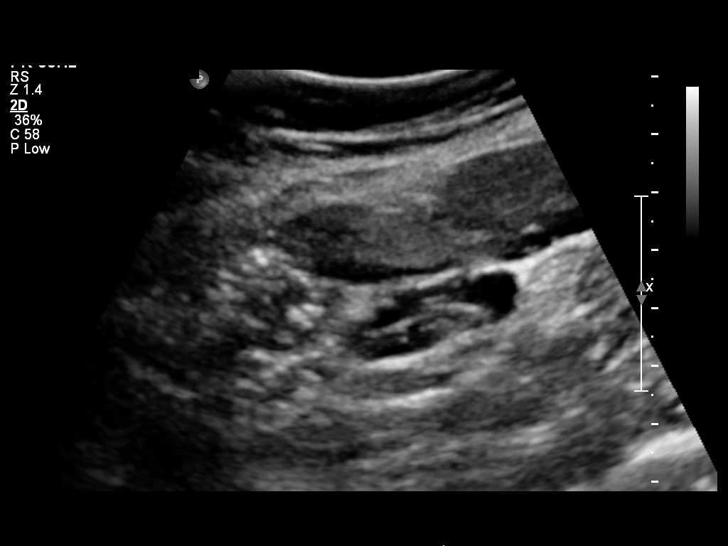
[im 93/97]
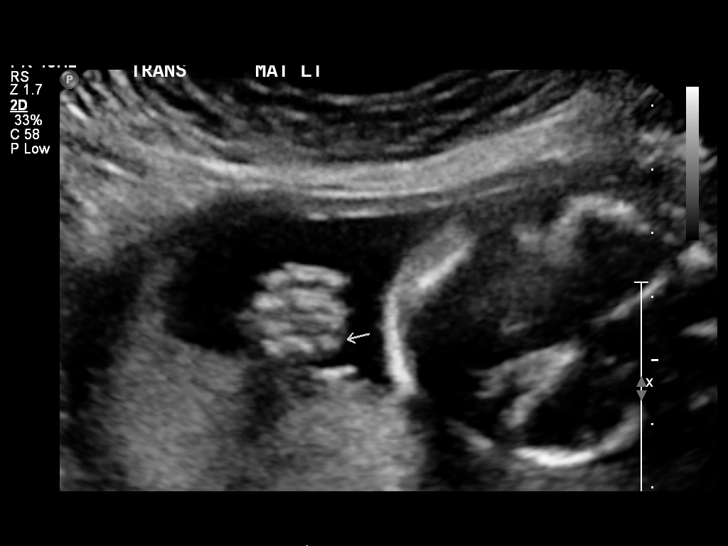

[12 of 28 positions shown; findings below may reference images not displayed]

OBSTETRICS REPORT
                      (Signed Final 09/04/2012 [DATE])

Service(s) Provided

 US OB DETAIL + 14 WK                                  76811.0
Indications

 Detailed fetal anatomic survey
Fetal Evaluation

 Num Of Fetuses:    1
 Fetal Heart Rate:  146                         bpm
 Cardiac Activity:  Observed
 Presentation:      Variable
 Placenta:          Right lateral, above
                    cervical os
 P. Cord            Visualized, central
 Insertion:

 Amniotic Fluid
 AFI FV:      Subjectively within normal limits
                                             Larg Pckt:     3.9  cm
Biometry

 BPD:     44.4  mm    G. Age:   19w 3d                CI:        73.15   70 - 86
                                                      FL/HC:      20.7   15.9 -

 HC:       165  mm    G. Age:   19w 1d      < 3  %    HC/AC:      1.09   1.06 -

 AC:     150.8  mm    G. Age:   20w 2d       20  %    FL/BPD:
 FL:      34.2  mm    G. Age:   20w 5d       28  %    FL/AC:      22.7   20 - 24
 HUM:     34.8  mm    G. Age:   21w 6d       67  %
 NFT:     4.27  mm

 Est. FW:     348  gm    0 lb 12 oz      27  %
Gestational Age

 LMP:           21w 1d       Date:   04/09/12                 EDD:   01/14/13
 U/S Today:     19w 6d                                        EDD:   01/23/13
 Best:          21w 1d    Det. By:   LMP  (04/09/12)          EDD:   01/14/13
2nd Trimester Genetic Sonogram - Trisomy 21 Screening
 Age:                                             32          Risk=1:   481

 Structural anomalies (inc. cardiac):             No
 Echogenic bowel:                                 No
 Hypoplastic / absent midphalanx 5th Digit:       No
 Wide space 6st-9nd toes:                         N/A
 Pyelectasis:                                     No
 2-vessel umbilical cord:                         No
 Echogenic cardiac foci:                          No
Anatomy

 Cranium:          Appears normal         Aortic Arch:      Appears normal
 Fetal Cavum:      Appears normal         Ductal Arch:      Not well visualized
 Ventricles:       Appears normal         Diaphragm:        Appears normal
 Choroid Plexus:   Appears normal         Stomach:          Appears normal, left
                                                            sided
 Cerebellum:       Appears normal         Abdomen:          Appears normal
 Posterior Fossa:  Appears normal         Abdominal Wall:   Appears nml (cord
                                                            insert, abd wall)
 Nuchal Fold:      Not applicable (>20    Cord Vessels:     Appears normal (3
                   wks GA)                                  vessel cord)
 Face:             Appears normal         Kidneys:          Appear normal
                   (orbits and profile)
 Lips:             Appears normal         Bladder:          Appears normal
 Heart:            Appears normal         Spine:            Appears normal
                   (4CH, axis, and
                   situs)
 RVOT:             Appears normal         Lower             Appears normal
                                          Extremities:
 LVOT:             Appears normal         Upper             Appears normal
                                          Extremities:

 Other:  Female gender. Nasal bone visualized. Heels and 5th digit visualized.
Cervix Uterus Adnexa

 Cervical Length:   3.7       cm

 Cervix:       Normal appearance by transabdominal scan.
 Left Ovary:   Within normal limits.
 Right Ovary:  Within normal limits.

 Adnexa:     No abnormality visualized.
Impression

 Siup demonstrating an EGA by ultrasound of 19w 6d. This is
 correlated with expected EGA by LMP of 21w 1d.

 No focal fetal or placental abnormalities are noted with a
 good anatomic evaluation possible. No soft markers for Down
 Syndrome are seen. Correlation with other aneuploidy
 screening results, if available, would be recommended for a
 more complete risk assessment.

 Subjectively and quantitatively normal amniotic fluid volume.
 Normal cervical length.
 questions or concerns.

## 2014-05-02 ENCOUNTER — Encounter (HOSPITAL_COMMUNITY): Payer: Self-pay | Admitting: *Deleted

## 2015-02-15 ENCOUNTER — Other Ambulatory Visit: Payer: Self-pay | Admitting: Internal Medicine

## 2015-02-15 ENCOUNTER — Ambulatory Visit
Admission: RE | Admit: 2015-02-15 | Discharge: 2015-02-15 | Disposition: A | Discharge status: Home / Self Care | Payer: PRIVATE HEALTH INSURANCE | Source: Ambulatory Visit | Attending: Internal Medicine | Admitting: Internal Medicine

## 2015-02-15 DIAGNOSIS — R109 Unspecified abdominal pain: Secondary | ICD-10-CM

## 2015-07-02 NOTE — L&D Delivery Note (Signed)
Operative Delivery Note At 9:40 AM a viable and healthy female was delivered via VBAC, Spontaneous.  Presentation: vertex; Position: Right,, Occiput,, Anterior;   Delivery of the head: 01/25/2016  9:40 AM  , McRoberts Second maneuver: , Suprapubic Pressure Third maneuver: N/A    Verbal consent: obtained from patient.  APGAR: 4, 7; weight 8 lb 5.2 oz (3775 g).   Placenta status:spontaneous, intact , .   Cord: 2 vessel with the following complications: none .  Cord pH:7.309.  Anesthesia:  epidural Episiotomy: None  Lacerations: 2nd degree;Perineal Suture Repair: 3.0 vicryl rapide Est. Blood Loss (mL): 200  Mom to postpartum.  Baby to Couplet care / Skin to Skin.   Ebony Smith is a 35 y.o. female [phone] with IUP at [redacted]w[redacted]d admitted for SOL with hx C/S x 2 then VBAC x 1 .  She progressed with augmentation to complete and pushed >1 hour to deliver with mild shoulder dystocia requiring McRoberts and suprapubic pressure only.   Infant with slow transition following 30 second shoulder dystocia so cord clamped and cut by CNM.  Placenta intact and spontaneous, bleeding minimal.  Second degree perineal laceration repaired without difficulty. Infant transitioning well by 5 minutes and remained in room.  Mom and baby stable prior to transfer to postpartum. She plans on breastfeeding. She requests BTL for birth control.  LEFTWICH-KIRBY, Tlaloc Taddei 01/25/2016, 11:35 AM

## 2015-07-24 LAB — OB RESULTS CONSOLE GC/CHLAMYDIA
CHLAMYDIA, DNA PROBE: NEGATIVE
Gonorrhea: NEGATIVE

## 2015-07-24 LAB — OB RESULTS CONSOLE HEPATITIS B SURFACE ANTIGEN: HEP B S AG: NEGATIVE

## 2015-07-24 LAB — OB RESULTS CONSOLE RUBELLA ANTIBODY, IGM: Rubella: IMMUNE

## 2015-07-24 LAB — OB RESULTS CONSOLE RPR: RPR: NONREACTIVE

## 2015-07-24 LAB — OB RESULTS CONSOLE HIV ANTIBODY (ROUTINE TESTING): HIV: NONREACTIVE

## 2015-08-13 ENCOUNTER — Telehealth: Payer: Self-pay | Admitting: Student

## 2015-08-13 ENCOUNTER — Encounter (HOSPITAL_COMMUNITY): Payer: Self-pay | Admitting: *Deleted

## 2015-08-13 ENCOUNTER — Inpatient Hospital Stay (HOSPITAL_COMMUNITY)
Admission: AD | Admit: 2015-08-13 | Discharge: 2015-08-13 | Disposition: A | Discharge status: Home / Self Care | Payer: Medicaid Other | Source: Ambulatory Visit | Attending: Obstetrics & Gynecology | Admitting: Obstetrics & Gynecology

## 2015-08-13 DIAGNOSIS — O9989 Other specified diseases and conditions complicating pregnancy, childbirth and the puerperium: Secondary | ICD-10-CM | POA: Diagnosis not present

## 2015-08-13 DIAGNOSIS — J029 Acute pharyngitis, unspecified: Secondary | ICD-10-CM | POA: Diagnosis present

## 2015-08-13 DIAGNOSIS — Z20828 Contact with and (suspected) exposure to other viral communicable diseases: Secondary | ICD-10-CM | POA: Diagnosis not present

## 2015-08-13 DIAGNOSIS — O26892 Other specified pregnancy related conditions, second trimester: Secondary | ICD-10-CM | POA: Insufficient documentation

## 2015-08-13 DIAGNOSIS — J101 Influenza due to other identified influenza virus with other respiratory manifestations: Secondary | ICD-10-CM

## 2015-08-13 DIAGNOSIS — R509 Fever, unspecified: Secondary | ICD-10-CM | POA: Diagnosis present

## 2015-08-13 DIAGNOSIS — R05 Cough: Secondary | ICD-10-CM | POA: Insufficient documentation

## 2015-08-13 DIAGNOSIS — E860 Dehydration: Secondary | ICD-10-CM | POA: Diagnosis not present

## 2015-08-13 DIAGNOSIS — R69 Illness, unspecified: Secondary | ICD-10-CM

## 2015-08-13 DIAGNOSIS — Z3A16 16 weeks gestation of pregnancy: Secondary | ICD-10-CM | POA: Insufficient documentation

## 2015-08-13 DIAGNOSIS — J111 Influenza due to unidentified influenza virus with other respiratory manifestations: Secondary | ICD-10-CM

## 2015-08-13 LAB — CBC
HCT: 30 % — ABNORMAL LOW (ref 36.0–46.0)
Hemoglobin: 10.2 g/dL — ABNORMAL LOW (ref 12.0–15.0)
MCH: 28.4 pg (ref 26.0–34.0)
MCHC: 34 g/dL (ref 30.0–36.0)
MCV: 83.6 fL (ref 78.0–100.0)
Platelets: 232 10*3/uL (ref 150–400)
RBC: 3.59 MIL/uL — ABNORMAL LOW (ref 3.87–5.11)
RDW: 15 % (ref 11.5–15.5)
WBC: 12.2 10*3/uL — ABNORMAL HIGH (ref 4.0–10.5)

## 2015-08-13 LAB — URINALYSIS, ROUTINE W REFLEX MICROSCOPIC
BILIRUBIN URINE: NEGATIVE
GLUCOSE, UA: NEGATIVE mg/dL
KETONES UR: 15 mg/dL — AB
Nitrite: NEGATIVE
PH: 6 (ref 5.0–8.0)
Protein, ur: 100 mg/dL — AB
Specific Gravity, Urine: >1.03 — ABNORMAL HIGH (ref 1.005–1.030)

## 2015-08-13 LAB — URINE MICROSCOPIC-ADD ON

## 2015-08-13 LAB — INFLUENZA PANEL BY PCR (TYPE A & B)
H1N1 flu by pcr: DETECTED — AB
Influenza A By PCR: POSITIVE — AB
Influenza B By PCR: NEGATIVE

## 2015-08-13 LAB — RAPID STREP SCREEN (MED CTR MEBANE ONLY): Streptococcus, Group A Screen (Direct): NEGATIVE

## 2015-08-13 MED ORDER — DEXTROSE 5 % IN LACTATED RINGERS IV BOLUS
1000.0000 mL | Freq: Once | INTRAVENOUS | Status: AC
  Administered 2015-08-13: 1000 mL via INTRAVENOUS

## 2015-08-13 MED ORDER — ACETAMINOPHEN 500 MG PO TABS
1000.0000 mg | ORAL_TABLET | Freq: Once | ORAL | Status: AC
  Administered 2015-08-13: 1000 mg via ORAL
  Filled 2015-08-13: qty 2

## 2015-08-13 MED ORDER — LACTATED RINGERS IV BOLUS (SEPSIS)
1000.0000 mL | Freq: Once | INTRAVENOUS | Status: AC
  Administered 2015-08-13: 1000 mL via INTRAVENOUS

## 2015-08-13 MED ORDER — OSELTAMIVIR PHOSPHATE 75 MG PO CAPS: 75.0000 mg | ORAL_CAPSULE | Freq: Two times a day (BID) | ORAL | Status: DC

## 2015-08-13 NOTE — MAU Note (Signed)
Pt C/O fever of 101.5 today, also cough & sore throat since yesterday.  Also has HA, denies abd pain or bleeding.

## 2015-08-13 NOTE — Telephone Encounter (Signed)
Informed patient of positive flu results. Will come back for work/school note. Already rx tamiflu. Household members should receive prophylaxis.  All questions answered.

## 2015-08-13 NOTE — MAU Provider Note (Signed)
History     CSN: [id number]  Arrival date and time: 08/13/15 1414   First Provider Initiated Contact with Patient 08/13/15 1440        Chief Complaint  Patient presents with  . Fever  . Cough  . Sore Throat   HPI Comments: Ebony Smith is a 35 y.o. G3P2002 at [redacted]w[redacted]d who presents with fever, sore throat, & cough. Son is currently being treated for the flu.   Fever  This is a new problem. The current episode started yesterday. The problem has been unchanged. The maximum temperature noted was 101 to 101.9 F. The temperature was taken using an oral thermometer. Associated symptoms include congestion, coughing, headaches, muscle aches and a sore throat. Pertinent negatives include no chest pain, ear pain, nausea, vomiting or wheezing. She has tried acetaminophen for the symptoms. The treatment provided no relief.  Sore Throat  The current episode started yesterday. The problem has been unchanged. The maximum temperature recorded prior to her arrival was 101 - 101.9 F. The fever has been present for less than 1 day. The pain is at a severity of 3/10. Associated symptoms include congestion, coughing and headaches. Pertinent negatives include no ear pain, hoarse voice, shortness of breath, trouble swallowing or vomiting. She has had no exposure to strep. Exposure to: son currently being treated for flu. She has tried acetaminophen for the symptoms. The treatment provided mild relief.  Cough The current episode started yesterday. The problem has been unchanged. The cough is non-productive. Associated symptoms include chills, a fever, headaches, nasal congestion and a sore throat. Pertinent negatives include no chest pain, ear pain, shortness of breath or wheezing. Nothing aggravates the symptoms. She has tried nothing for the symptoms. There is no history of asthma.    OB History    Gravida Para Term Preterm AB TAB SAB Ectopic Multiple Living   0 0 0 0 0 0 2      Past Medical History   Diagnosis Date  . No pertinent past medical history   . Medical history non-contributory     Past Surgical History  Procedure Laterality Date  . Cesarean section      Family History  Problem Relation Age of Onset  . Alcohol abuse Neg Hx   . Asthma Neg Hx   . Arthritis Neg Hx   . Birth defects Neg Hx   . Cancer Neg Hx   . COPD Neg Hx   . Depression Neg Hx   . Diabetes Neg Hx   . Drug abuse Neg Hx   . Early death Neg Hx   . Hearing loss Neg Hx   . Heart disease Neg Hx   . Hyperlipidemia Neg Hx   . Hypertension Neg Hx   . Kidney disease Neg Hx   . Learning disabilities Neg Hx   . Mental illness Neg Hx   . Mental retardation Neg Hx   . Miscarriages / Stillbirths Neg Hx   . Stroke Neg Hx   . Vision loss Neg Hx     Social History  Substance Use Topics  . Smoking status: Never Smoker   . Smokeless tobacco: Never Used  . Alcohol Use: No    Allergies: No Known Allergies  Prescriptions prior to admission  Medication Sig Dispense Refill Last Dose  . acetaminophen (TYLENOL) 325 MG tablet Take 650 mg by mouth every 6 (six) hours as needed for mild pain or headache.   08/13/2015 at Unknown time  .  Prenatal Vit-Fe Fumarate-FA (PRENATAL MULTIVITAMIN) TABS Take 1 tablet by mouth daily at 12 noon.   08/12/2015 at Unknown time  . ibuprofen (ADVIL,MOTRIN) 600 MG tablet Take 1 tablet (600 mg total) by mouth every 6 (six) hours. (Patient not taking: Reported on 08/13/2015) 30 tablet 1   . senna-docusate (SENOKOT-S) 8.6-50 MG per tablet Take 2 tablets by mouth at bedtime. (Patient not taking: Reported on 08/13/2015) 30 tablet 0    Results for orders placed or performed during the hospital encounter of 08/13/15 (from the past 24 hour(s))  Urinalysis, Routine w reflex microscopic (not at [facility])     Status: Abnormal   Collection Time: 08/13/15  2:10 PM  Result Value Ref Range   Color, Urine YELLOW YELLOW   APPearance HAZY (A) CLEAR   Specific Gravity, Urine >1.030 (H) 1.005 - 1.030   pH  6.0 5.0 - 8.0   Glucose, UA NEGATIVE NEGATIVE mg/dL   Hgb urine dipstick SMALL (A) NEGATIVE   Bilirubin Urine NEGATIVE NEGATIVE   Ketones, ur 15 (A) NEGATIVE mg/dL   Protein, ur 454 (A) NEGATIVE mg/dL   Nitrite NEGATIVE NEGATIVE   Leukocytes, UA TRACE (A) NEGATIVE  Urine microscopic-add on     Status: Abnormal   Collection Time: 08/13/15  2:10 PM  Result Value Ref Range   Squamous Epithelial / LPF 0-5 (A) NONE SEEN   WBC, UA 6-30 0 - 5 WBC/hpf   RBC / HPF 0-5 0 - 5 RBC/hpf   Bacteria, UA MANY (A) NONE SEEN   Urine-Other      LESS THAN 10 ML , MICROSCOPIC DONE ON UNCONCENTRATED SPECIMEN  CBC     Status: Abnormal   Collection Time: 08/13/15  2:49 PM  Result Value Ref Range   WBC 12.2 (H) 4.0 - 10.5 K/uL   RBC 3.59 (L) 3.87 - 5.11 MIL/uL   Hemoglobin 10.2 (L) 12.0 - 15.0 g/dL   HCT 09.8 (L) 11.9 - 14.7 %   MCV 83.6 78.0 - 100.0 fL   MCH 28.4 26.0 - 34.0 pg   MCHC 34.0 30.0 - 36.0 g/dL   RDW 82.9 56.2 - 13.0 %   Platelets 232 150 - 400 K/uL  Rapid strep screen (not at [facility])     Status: None   Collection Time: 08/13/15  3:00 PM  Result Value Ref Range   Streptococcus, Group A Screen (Direct) NEGATIVE NEGATIVE    Review of Systems  Constitutional: Positive for fever, chills and malaise/fatigue.  HENT: Positive for congestion and sore throat. Negative for ear pain, hoarse voice and trouble swallowing.   Eyes: Negative for photophobia.  Respiratory: Positive for cough. Negative for sputum production, shortness of breath and wheezing.   Cardiovascular: Negative for chest pain and palpitations.  Gastrointestinal: Negative for nausea and vomiting.  Genitourinary: Negative for urgency, frequency, hematuria and flank pain.  Musculoskeletal: Positive for back pain (Low back pain).  Neurological: Positive for weakness and headaches.   Physical Exam   Blood pressure 109/66, pulse 116, temperature 99.3 F (37.4 C), temperature source Oral, resp. rate 18, SpO2 98 %, unknown if  currently breastfeeding.  Physical Exam  Constitutional: She is oriented to person, place, and time. She appears well-developed and well-nourished. No distress.  NAD but sickly looking  HENT:  Head: Normocephalic and atraumatic.  Right Ear: External ear normal.  Left Ear: External ear normal.  Nose: Rhinorrhea present. Right sinus exhibits no maxillary sinus tenderness and no frontal sinus tenderness. Left sinus exhibits no maxillary sinus  tenderness and no frontal sinus tenderness.  Mouth/Throat: Mucous membranes are normal. Posterior oropharyngeal erythema present. No oropharyngeal exudate or posterior oropharyngeal edema.  Cardiovascular: Regular rhythm and normal heart sounds.  Tachycardia present.   Respiratory: Effort normal and breath sounds normal. No respiratory distress. She has no wheezes. She has no rales.  GI: Soft. Bowel sounds are normal. She exhibits no distension. There is no tenderness. There is no rebound and no guarding.  Lymphadenopathy:    She has no cervical adenopathy.  Neurological: She is alert and oriented to person, place, and time.  Skin: Skin is warm and dry. She is not diaphoretic.  Psychiatric: She has a normal mood and affect. Her behavior is normal. Judgment and thought content normal.    MAU Course  Procedures Results for orders placed or performed during the hospital encounter of 08/13/15 (from the past 24 hour(s))  Urinalysis, Routine w reflex microscopic (not at [facility])     Status: Abnormal   Collection Time: 08/13/15  2:10 PM  Result Value Ref Range   Color, Urine YELLOW YELLOW   APPearance HAZY (A) CLEAR   Specific Gravity, Urine >1.030 (H) 1.005 - 1.030   pH 6.0 5.0 - 8.0   Glucose, UA NEGATIVE NEGATIVE mg/dL   Hgb urine dipstick SMALL (A) NEGATIVE   Bilirubin Urine NEGATIVE NEGATIVE   Ketones, ur 15 (A) NEGATIVE mg/dL   Protein, ur 161 (A) NEGATIVE mg/dL   Nitrite NEGATIVE NEGATIVE   Leukocytes, UA TRACE (A) NEGATIVE  Urine microscopic-add  on     Status: Abnormal   Collection Time: 08/13/15  2:10 PM  Result Value Ref Range   Squamous Epithelial / LPF 0-5 (A) NONE SEEN   WBC, UA 6-30 0 - 5 WBC/hpf   RBC / HPF 0-5 0 - 5 RBC/hpf   Bacteria, UA MANY (A) NONE SEEN   Urine-Other      LESS THAN 10 ML , MICROSCOPIC DONE ON UNCONCENTRATED SPECIMEN  CBC     Status: Abnormal   Collection Time: 08/13/15  2:49 PM  Result Value Ref Range   WBC 12.2 (H) 4.0 - 10.5 K/uL   RBC 3.59 (L) 3.87 - 5.11 MIL/uL   Hemoglobin 10.2 (L) 12.0 - 15.0 g/dL   HCT 09.6 (L) 04.5 - 40.9 %   MCV 83.6 78.0 - 100.0 fL   MCH 28.4 26.0 - 34.0 pg   MCHC 34.0 30.0 - 36.0 g/dL   RDW 81.1 91.4 - 78.2 %   Platelets 232 150 - 400 K/uL  Influenza panel by PCR (type A & B, H1N1)     Status: Abnormal   Collection Time: 08/13/15  3:00 PM  Result Value Ref Range   Influenza A By PCR POSITIVE (A) NEGATIVE   Influenza B By PCR NEGATIVE NEGATIVE   H1N1 flu by pcr DETECTED (A) NOT DETECTED  Rapid strep screen (not at [facility])     Status: None   Collection Time: 08/13/15  3:00 PM  Result Value Ref Range   Streptococcus, Group A Screen (Direct) NEGATIVE NEGATIVE    MDM UA- ketones present, concern for dehydration, IV fluids ordered IV fluids- patient is tachycardic, 1L of dextrose 5% LR, 1L LR CBC- wnl Rapid strep- negative, culture ordered Rapid flu- pending  Tylenol 1 gm PO - temperature came down to 99.3  FHT 171 by doppler Assessment and Plan  1. Dehydration- patient is is febrile and has had poor PO intake, fluids given in MAU, drink plenty of PO fluid  2. Exposure to flu- rapid flu pending, tamiflu prescribed, tylenol for fever  Demetrios Loll 08/13/2015, 4:39 PM   A:  1. Exposure to the flu   2. Influenza-like illness      P: Discharge home Rx tamiflu Increase oral fluid intake Tylenol prn fever & body aches Flu swab pending  I have seen and evaluated the patient with the NP/PA/Med student. I agree with the assessment and plan as written  above. See provider note for full documentation  Judeth Horn, NP  08/13/2015 5:47 PM

## 2015-08-13 NOTE — Discharge Instructions (Signed)

## 2015-08-14 ENCOUNTER — Encounter: Payer: Self-pay | Admitting: Student

## 2015-08-14 LAB — CULTURE, OB URINE

## 2015-08-16 LAB — CULTURE, GROUP A STREP (THRC)

## 2015-11-24 ENCOUNTER — Encounter (HOSPITAL_COMMUNITY): Payer: Self-pay | Admitting: Obstetrics & Gynecology

## 2015-12-29 LAB — OB RESULTS CONSOLE GBS: GBS: NEGATIVE

## 2016-01-24 ENCOUNTER — Inpatient Hospital Stay (HOSPITAL_COMMUNITY): Payer: Medicaid Other | Admitting: Anesthesiology

## 2016-01-24 ENCOUNTER — Encounter (HOSPITAL_COMMUNITY): Payer: Self-pay | Admitting: *Deleted

## 2016-01-24 ENCOUNTER — Inpatient Hospital Stay (HOSPITAL_COMMUNITY)
Admission: AD | Admit: 2016-01-24 | Discharge: 2016-01-27 | DRG: 767 | Disposition: A | Discharge status: Home / Self Care | Payer: Medicaid Other | Source: Ambulatory Visit | Attending: Obstetrics and Gynecology | Admitting: Obstetrics and Gynecology

## 2016-01-24 DIAGNOSIS — O34219 Maternal care for unspecified type scar from previous cesarean delivery: Secondary | ICD-10-CM

## 2016-01-24 DIAGNOSIS — IMO0001 Reserved for inherently not codable concepts without codable children: Secondary | ICD-10-CM

## 2016-01-24 DIAGNOSIS — Z3483 Encounter for supervision of other normal pregnancy, third trimester: Secondary | ICD-10-CM | POA: Diagnosis present

## 2016-01-24 DIAGNOSIS — O34211 Maternal care for low transverse scar from previous cesarean delivery: Secondary | ICD-10-CM | POA: Diagnosis present

## 2016-01-24 DIAGNOSIS — Z3A40 40 weeks gestation of pregnancy: Secondary | ICD-10-CM

## 2016-01-24 DIAGNOSIS — Z302 Encounter for sterilization: Secondary | ICD-10-CM

## 2016-01-24 LAB — CBC
HCT: 36.2 % (ref 36.0–46.0)
HEMOGLOBIN: 12.5 g/dL (ref 12.0–15.0)
MCH: 29.7 pg (ref 26.0–34.0)
MCHC: 34.5 g/dL (ref 30.0–36.0)
MCV: 86 fL (ref 78.0–100.0)
PLATELETS: 217 10*3/uL (ref 150–400)
RBC: 4.21 MIL/uL (ref 3.87–5.11)
RDW: 15.8 % — ABNORMAL HIGH (ref 11.5–15.5)
WBC: 20.2 10*3/uL — ABNORMAL HIGH (ref 4.0–10.5)

## 2016-01-24 LAB — TYPE AND SCREEN
ABO/RH(D): O POS
ANTIBODY SCREEN: NEGATIVE

## 2016-01-24 MED ORDER — EPHEDRINE 5 MG/ML INJ: 10.0000 mg | INTRAVENOUS | Status: DC | PRN

## 2016-01-24 MED ORDER — FLEET ENEMA 7-19 GM/118ML RE ENEM: 1.0000 | ENEMA | RECTAL | Status: DC | PRN

## 2016-01-24 MED ORDER — PHENYLEPHRINE 40 MCG/ML (10ML) SYRINGE FOR IV PUSH (FOR BLOOD PRESSURE SUPPORT)
80.0000 ug | PREFILLED_SYRINGE | INTRAVENOUS | Status: DC | PRN
  Filled 2016-01-24: qty 10

## 2016-01-24 MED ORDER — OXYTOCIN BOLUS FROM INFUSION: 500.0000 mL | Freq: Once | INTRAVENOUS | Status: DC

## 2016-01-24 MED ORDER — DIPHENHYDRAMINE HCL 50 MG/ML IJ SOLN: 12.5000 mg | INTRAMUSCULAR | Status: DC | PRN

## 2016-01-24 MED ORDER — SOD CITRATE-CITRIC ACID 500-334 MG/5ML PO SOLN: 30.0000 mL | ORAL | Status: DC | PRN

## 2016-01-24 MED ORDER — ONDANSETRON HCL 4 MG/2ML IJ SOLN: 4.0000 mg | Freq: Four times a day (QID) | INTRAMUSCULAR | Status: DC | PRN

## 2016-01-24 MED ORDER — ACETAMINOPHEN 325 MG PO TABS
650.0000 mg | ORAL_TABLET | ORAL | Status: DC | PRN
  Administered 2016-01-25: 650 mg via ORAL
  Filled 2016-01-24: qty 2

## 2016-01-24 MED ORDER — LACTATED RINGERS IV SOLN: 500.0000 mL | INTRAVENOUS | Status: DC | PRN

## 2016-01-24 MED ORDER — OXYCODONE-ACETAMINOPHEN 5-325 MG PO TABS: 2.0000 | ORAL_TABLET | ORAL | Status: DC | PRN

## 2016-01-24 MED ORDER — OXYTOCIN 40 UNITS IN LACTATED RINGERS INFUSION - SIMPLE MED
2.5000 [IU]/h | INTRAVENOUS | Status: DC
  Filled 2016-01-24: qty 1000

## 2016-01-24 MED ORDER — LIDOCAINE HCL (PF) 1 % IJ SOLN
INTRAMUSCULAR | Status: DC | PRN
  Administered 2016-01-24: 4 mL via EPIDURAL
  Administered 2016-01-24: 4 mL
  Administered 2016-01-24: 4 mL via EPIDURAL

## 2016-01-24 MED ORDER — LACTATED RINGERS IV SOLN
INTRAVENOUS | Status: DC
  Administered 2016-01-24 – 2016-01-25 (×2): via INTRAVENOUS

## 2016-01-24 MED ORDER — OXYCODONE-ACETAMINOPHEN 5-325 MG PO TABS: 1.0000 | ORAL_TABLET | ORAL | Status: DC | PRN

## 2016-01-24 MED ORDER — PHENYLEPHRINE 40 MCG/ML (10ML) SYRINGE FOR IV PUSH (FOR BLOOD PRESSURE SUPPORT)
80.0000 ug | PREFILLED_SYRINGE | INTRAVENOUS | Status: DC | PRN
Start: 2016-01-24 — End: 2016-01-25

## 2016-01-24 MED ORDER — LACTATED RINGERS IV SOLN
500.0000 mL | Freq: Once | INTRAVENOUS | Status: AC
  Administered 2016-01-24: 500 mL via INTRAVENOUS

## 2016-01-24 MED ORDER — FENTANYL 2.5 MCG/ML BUPIVACAINE 1/10 % EPIDURAL INFUSION (WH - ANES)
14.0000 mL/h | INTRAMUSCULAR | Status: DC | PRN
Start: 2016-01-24 — End: 2016-01-25
  Administered 2016-01-24 – 2016-01-25 (×3): 14 mL/h via EPIDURAL
  Filled 2016-01-24 (×4): qty 125

## 2016-01-24 MED ORDER — LIDOCAINE HCL (PF) 1 % IJ SOLN
30.0000 mL | INTRAMUSCULAR | Status: DC | PRN
  Filled 2016-01-24: qty 30

## 2016-01-24 NOTE — H&P (Signed)
LABOR AND DELIVERY ADMISSION HISTORY AND PHYSICAL NOTE  Ebony Smith is a 35 y.o. female G3P2002 [facility] with IUP at [redacted]w[redacted]d presenting for SOL. Ctx started this morning ~6 am and have been getting stronger and closer together. Reports water has not broken yet. She denies leakage of fluid or vaginal bleeding. She reports positive fetal movement.  Ebony Smith has had 1 C/S and her last delivery was vaginal (VBAC) reportedly without difficulties.  Prenatal History/Complications:  Past Medical History: Past Medical History:  Diagnosis Date  . Medical history non-contributory   . No pertinent past medical history     Past Surgical History: Past Surgical History:  Procedure Laterality Date  . CESAREAN SECTION      Obstetrical History: OB History    Gravida Para Term Preterm AB Living   3 2 2  0 0 2   SAB TAB Ectopic Multiple Live Births   0 0 0 0 2      Social History: Social History   Social History  . Marital status: Married    Spouse name: N/A  . Number of children: N/A  . Years of education: N/A   Social History Main Topics  . Smoking status: Never Smoker  . Smokeless tobacco: Never Used  . Alcohol use No  . Drug use: No  . Sexual activity: Not Asked   Other Topics Concern  . None   Social History Narrative  . None    Family History: Family History  Problem Relation Age of Onset  . Alcohol abuse Neg Hx   . Asthma Neg Hx   . Arthritis Neg Hx   . Birth defects Neg Hx   . Cancer Neg Hx   . COPD Neg Hx   . Depression Neg Hx   . Diabetes Neg Hx   . Drug abuse Neg Hx   . Early death Neg Hx   . Hearing loss Neg Hx   . Heart disease Neg Hx   . Hyperlipidemia Neg Hx   . Hypertension Neg Hx   . Kidney disease Neg Hx   . Learning disabilities Neg Hx   . Mental illness Neg Hx   . Mental retardation Neg Hx   . Miscarriages / Stillbirths Neg Hx   . Stroke Neg Hx   . Vision loss Neg Hx     Allergies: No Known  Allergies  Prescriptions Prior to Admission  Medication Sig Dispense Refill Last Dose  . Prenatal Vit-Fe Fumarate-FA (PRENATAL MULTIVITAMIN) TABS Take 1 tablet by mouth at bedtime.    01/23/2016 at Unknown time  . oseltamivir (TAMIFLU) 75 MG capsule Take 1 capsule (75 mg total) by mouth every 12 (twelve) hours. (Patient not taking: Reported on 01/24/2016) 10 capsule 0 Completed Course at Unknown time     Review of Systems   Neg: HA, trouble breathing, chest pains, N/V  Blood pressure 111/70, pulse 91, temperature 98.2 F (36.8 C), temperature source Oral, resp. rate 18, unknown if currently breastfeeding.   General appearance: alert, cooperative and mild distress Lungs: clear to auscultation bilaterally Heart: regular rate and rhythm Abdomen: soft, non-tender; bowel sounds normal Extremities: No calf swelling or tenderness Presentation: vertex  Fetal monitoring: Mode External filed at 01/24/2016 1930  Baseline Rate (A) 125 bpm filed at 01/24/2016 1943  Variability 6-25 BPM filed at 01/24/2016 1943  Accelerations 15 x 15 filed at 01/24/2016 1938  Decelerations None filed at 01/24/2016 1938  Multiple birth? N filed at 01/24/2016 1720   Uterine  activity: Moderate ctx 3-4 mins each, 100-150 secs each (regular)  Dilation: 4 Effacement (%): 90 Station: -3, -2 Exam by:: M.Merrill, RN   Prenatal labs: ABO, Rh:  O pos Antibody:  Neg Rubella: immune RPR:   in process HBsAg:   Negative HIV:   nonreactive GBS:   Neg 1 hr Glucola: Negative Genetic screening: Normal Anatomy US: Normal - Boy  Prenatal Transfer Tool  Maternal Diabetes: No Genetic Screening: Normal Maternal Ultrasounds/Referrals: Normal Fetal Ultrasounds or other Referrals:  None Maternal Substance Abuse:  No Significant Maternal Medications:  None Significant Maternal Lab Results:  Results for orders placed or performed during the hospital encounter of 01/24/16 (from the past 24 hour(s))  CBC     Status:  Abnormal   Collection Time: 01/24/16  5:10 PM  Result Value Ref Range   WBC 20.2 (H) 4.0 - 10.5 K/uL   RBC 4.21 3.87 - 5.11 MIL/uL   Hemoglobin 12.5 12.0 - 15.0 g/dL   HCT 47.8 29.5 - 62.1 %   MCV 86.0 78.0 - 100.0 fL   MCH 29.7 26.0 - 34.0 pg   MCHC 34.5 30.0 - 36.0 g/dL   RDW 30.8 (H) 65.7 - 84.6 %   Platelets 217 150 - 400 K/uL  Type and screen [facility]     Status: None   Collection Time: 01/24/16  5:10 PM  Result Value Ref Range   ABO/RH(D) O POS    Antibody Screen NEG    Sample Expiration 01/27/2016      No results found for this or any previous visit (from the past 24 hour(s)).  Patient Active Problem List   Diagnosis Date Noted  . VBAC, delivered 01/19/2013    Assessment: Ebony Smith is a healthy 35 y.o. G3P2002 at [redacted]w[redacted]d with prior VBAC here for SOL and labor management.  #Labor: Expectant management #Pain: Epidural #FWB: Category 1 #ID:  GBS neg #MOF: Breast w/ bottle supplement #MOC: BLTL - reportedly signed papers #Circ: Boy - No circ  Andres Ege, MD, PGY-1, MPH 01/24/2016, 4:46 PM

## 2016-01-24 NOTE — Anesthesia Pain Management Evaluation Note (Signed)
  CRNA Pain Management Visit Note  Patient: Ebony Smith, 35 y.o., female  "Hello I am a member of the anesthesia team at [facility]. We have an anesthesia team available at all times to provide care throughout the hospital, including epidural management and anesthesia for C-section. I don't know your plan for the delivery whether it a natural birth, water birth, IV sedation, nitrous supplementation, doula or epidural, but we want to meet your pain goals."   1.Was your pain managed to your expectations on prior hospitalizations?   Yes   2.What is your expectation for pain management during this hospitalization?     Epidural  3.How can we help you reach that goal?   Record the patient's initial score and the patient's pain goal.   Pain: 6  Pain Goal: 7 [facility] wants you to be able to say your pain was always managed very well.  Cephus Shelling 01/24/2016

## 2016-01-24 NOTE — Progress Notes (Signed)
Provider notified of pt arrival to MAU.  Notified of a G3P2 at [redacted]w[redacted]d.  Notified that pt has a history of 1 c-section and 1 VBAC.  Notified that pt is 4, 80, -3, and vertex.  Notified that pt is GBS negative.

## 2016-01-24 NOTE — Anesthesia Procedure Notes (Signed)
Epidural Patient location during procedure: OB  Staffing Anesthesiologist: Isaura Schiller  Preanesthetic Checklist Completed: patient identified, site marked, surgical consent, pre-op evaluation, timeout performed, IV checked, risks and benefits discussed and monitors and equipment checked  Epidural Patient position: sitting Prep: site prepped and draped and DuraPrep Patient monitoring: continuous pulse ox and blood pressure Approach: midline Location: L2-L3 Injection technique: LOR air  Needle:  Needle type: Tuohy  Needle gauge: 17 G Needle length: 9 cm and 9 Needle insertion depth: 5 cm cm Catheter type: closed end flexible Catheter size: 19 Gauge Catheter at skin depth: 10 cm Test dose: negative  Assessment Events: blood not aspirated, injection not painful, no injection resistance, negative IV test and no paresthesia

## 2016-01-24 NOTE — MAU Note (Signed)
Been contracting since 0600.  Getting closer and stronger. No bleeding or leaking. Was 1 cm when last checked.

## 2016-01-24 NOTE — Anesthesia Preprocedure Evaluation (Signed)
Anesthesia Evaluation  Patient identified by MRN, date of birth, ID band Patient awake    Reviewed: Allergy & Precautions, NPO status , Patient's Chart, lab work & pertinent test results  Airway Mallampati: I  TM Distance: >3 FB Neck ROM: Full    Dental  (+) Teeth Intact, Dental Advisory Given   Pulmonary    breath sounds clear to auscultation       Cardiovascular  Rhythm:Regular Rate:Normal     Neuro/Psych    GI/Hepatic   Endo/Other    Renal/GU      Musculoskeletal   Abdominal   Peds  Hematology   Anesthesia Other Findings   Reproductive/Obstetrics                             Anesthesia Physical Anesthesia Plan  ASA: II  Anesthesia Plan: Epidural   Post-op Pain Management:    Induction:   Airway Management Planned:   Additional Equipment:   Intra-op Plan:   Post-operative Plan:   Informed Consent: I have reviewed the patients History and Physical, chart, labs and discussed the procedure including the risks, benefits and alternatives for the proposed anesthesia with the patient or authorized representative who has indicated his/her understanding and acceptance.     Plan Discussed with: Anesthesiologist  Anesthesia Plan Comments:         Anesthesia Quick Evaluation

## 2016-01-24 NOTE — Progress Notes (Signed)
LABOR PROGRESS NOTE  Ebony Smith is a 35 y.o. G3P2002 at [redacted]w[redacted]d  admitted for SOOL.   Subjective: Pt feeling off and on pressure with contractions.  Objective: BP 131/71   Pulse (!) 101   Temp 98.9 F (37.2 C) (Oral)   Resp 18   Ht 5\' 1"  (1.549 m)   Wt 71.7 kg (158 lb)   SpO2 97%   BMI 29.85 kg/m  or  Vitals:   01/24/16 2200 01/24/16 2230 01/24/16 2300 01/24/16 2325  BP: 121/77 (!) 99/57 131/71   Pulse: (!) 104 85 (!) 101   Resp: 16 18 18    Temp:    98.9 F (37.2 C)  TempSrc:    Oral  SpO2:      Weight:      Height:        Dilation: 7 Effacement (%): 100 Station: -2 Presentation: Vertex Exam by:: Dr. Chanetta Marshall  Labs: Lab Results  Component Value Date   WBC 20.2 (H) 01/24/2016   HGB 12.5 01/24/2016   HCT 36.2 01/24/2016   MCV 86.0 01/24/2016   PLT 217 01/24/2016    Patient Active Problem List   Diagnosis Date Noted  . Active labor 01/24/2016  . VBAC, delivered 01/19/2013    Assessment / Plan: 35 y.o. G3P2002 at [redacted]w[redacted]d here for SOOL.  Labor: AROM@2315 , clear fluid Fetal Wellbeing:  Cat 1 Pain Control:  S/p epidural Anticipated MOD:  SVD  Loni Muse, MD 01/24/2016, 11:26 PM

## 2016-01-25 ENCOUNTER — Inpatient Hospital Stay (HOSPITAL_COMMUNITY): Payer: Medicaid Other | Admitting: Certified Registered Nurse Anesthetist

## 2016-01-25 ENCOUNTER — Encounter (HOSPITAL_COMMUNITY): Payer: Self-pay | Admitting: *Deleted

## 2016-01-25 ENCOUNTER — Encounter (HOSPITAL_COMMUNITY)
Admission: AD | Disposition: A | Discharge status: Home / Self Care | Payer: Self-pay | Source: Ambulatory Visit | Attending: Obstetrics and Gynecology

## 2016-01-25 DIAGNOSIS — O34219 Maternal care for unspecified type scar from previous cesarean delivery: Secondary | ICD-10-CM

## 2016-01-25 DIAGNOSIS — O34211 Maternal care for low transverse scar from previous cesarean delivery: Secondary | ICD-10-CM

## 2016-01-25 DIAGNOSIS — Z302 Encounter for sterilization: Secondary | ICD-10-CM

## 2016-01-25 DIAGNOSIS — Z3A40 40 weeks gestation of pregnancy: Secondary | ICD-10-CM

## 2016-01-25 HISTORY — PX: TUBAL LIGATION: SHX77

## 2016-01-25 LAB — RPR: RPR Ser Ql: NONREACTIVE

## 2016-01-25 SURGERY — LIGATION, FALLOPIAN TUBE, POSTPARTUM
Anesthesia: Epidural | Site: Abdomen | Laterality: Bilateral

## 2016-01-25 MED ORDER — LIDOCAINE-EPINEPHRINE (PF) 2 %-1:200000 IJ SOLN
INTRAMUSCULAR | Status: DC | PRN
  Administered 2016-01-25: 5 mL

## 2016-01-25 MED ORDER — BENZOCAINE-MENTHOL 20-0.5 % EX AERO: 1.0000 "application " | INHALATION_SPRAY | CUTANEOUS | Status: DC | PRN

## 2016-01-25 MED ORDER — ACETAMINOPHEN 325 MG PO TABS
650.0000 mg | ORAL_TABLET | ORAL | Status: DC | PRN
  Administered 2016-01-26 – 2016-01-27 (×4): 650 mg via ORAL
  Filled 2016-01-25 (×4): qty 2

## 2016-01-25 MED ORDER — TERBUTALINE SULFATE 1 MG/ML IJ SOLN: 0.2500 mg | Freq: Once | INTRAMUSCULAR | Status: DC | PRN

## 2016-01-25 MED ORDER — ONDANSETRON HCL 4 MG/2ML IJ SOLN
4.0000 mg | INTRAMUSCULAR | Status: DC | PRN
Start: 2016-01-25 — End: 2016-01-27

## 2016-01-25 MED ORDER — BUPIVACAINE HCL (PF) 0.25 % IJ SOLN
INTRAMUSCULAR | Status: AC
  Filled 2016-01-25: qty 30

## 2016-01-25 MED ORDER — DIBUCAINE 1 % RE OINT: 1.0000 "application " | TOPICAL_OINTMENT | RECTAL | Status: DC | PRN

## 2016-01-25 MED ORDER — IBUPROFEN 600 MG PO TABS
600.0000 mg | ORAL_TABLET | Freq: Four times a day (QID) | ORAL | Status: DC
  Administered 2016-01-25 – 2016-01-27 (×9): 600 mg via ORAL
  Filled 2016-01-25 (×9): qty 1

## 2016-01-25 MED ORDER — TETANUS-DIPHTH-ACELL PERTUSSIS 5-2.5-18.5 LF-MCG/0.5 IM SUSP: 0.5000 mL | Freq: Once | INTRAMUSCULAR | Status: DC

## 2016-01-25 MED ORDER — HYDROMORPHONE HCL 1 MG/ML IJ SOLN: 0.2500 mg | INTRAMUSCULAR | Status: DC | PRN

## 2016-01-25 MED ORDER — ZOLPIDEM TARTRATE 5 MG PO TABS: 5.0000 mg | ORAL_TABLET | Freq: Every evening | ORAL | Status: DC | PRN

## 2016-01-25 MED ORDER — ONDANSETRON HCL 4 MG PO TABS: 4.0000 mg | ORAL_TABLET | ORAL | Status: DC | PRN

## 2016-01-25 MED ORDER — SENNOSIDES-DOCUSATE SODIUM 8.6-50 MG PO TABS: 2.0000 | ORAL_TABLET | ORAL | Status: DC

## 2016-01-25 MED ORDER — SODIUM BICARBONATE 8.4 % IV SOLN
INTRAVENOUS | Status: DC | PRN
  Administered 2016-01-25: 4 mL via EPIDURAL

## 2016-01-25 MED ORDER — ONDANSETRON HCL 4 MG/2ML IJ SOLN
INTRAMUSCULAR | Status: AC
  Filled 2016-01-25: qty 2

## 2016-01-25 MED ORDER — SENNOSIDES-DOCUSATE SODIUM 8.6-50 MG PO TABS
2.0000 | ORAL_TABLET | ORAL | Status: DC
  Administered 2016-01-25 – 2016-01-27 (×2): 2 via ORAL
  Filled 2016-01-25 (×2): qty 2

## 2016-01-25 MED ORDER — MUPIROCIN 2 % EX OINT
TOPICAL_OINTMENT | Freq: Two times a day (BID) | CUTANEOUS | Status: DC
  Filled 2016-01-25: qty 22

## 2016-01-25 MED ORDER — GENTAMICIN SULFATE 40 MG/ML IJ SOLN
150.0000 mg | Freq: Once | INTRAVENOUS | Status: AC
  Administered 2016-01-25: 150 mg via INTRAVENOUS
  Filled 2016-01-25: qty 3.75

## 2016-01-25 MED ORDER — OXYTOCIN 40 UNITS IN LACTATED RINGERS INFUSION - SIMPLE MED
1.0000 m[IU]/min | INTRAVENOUS | Status: DC
  Administered 2016-01-25: 2 m[IU]/min via INTRAVENOUS

## 2016-01-25 MED ORDER — MIDAZOLAM HCL 2 MG/2ML IJ SOLN
INTRAMUSCULAR | Status: DC | PRN
  Administered 2016-01-25: 2 mg via INTRAVENOUS

## 2016-01-25 MED ORDER — BUPIVACAINE HCL (PF) 0.5 % IJ SOLN
INTRAMUSCULAR | Status: DC | PRN
  Administered 2016-01-25: 5 mL

## 2016-01-25 MED ORDER — BUPIVACAINE HCL (PF) 0.25 % IJ SOLN
INTRAMUSCULAR | Status: DC | PRN
  Administered 2016-01-25: 10 mL

## 2016-01-25 MED ORDER — ONDANSETRON HCL 4 MG/2ML IJ SOLN: 4.0000 mg | INTRAMUSCULAR | Status: DC | PRN

## 2016-01-25 MED ORDER — SODIUM BICARBONATE 8.4 % IV SOLN
INTRAVENOUS | Status: AC
  Filled 2016-01-25: qty 50

## 2016-01-25 MED ORDER — DEXTROSE 5 % IV SOLN
130.0000 mg | Freq: Three times a day (TID) | INTRAVENOUS | Status: DC
  Filled 2016-01-25: qty 3.25

## 2016-01-25 MED ORDER — SODIUM BICARBONATE 8.4 % IV SOLN
INTRAVENOUS | Status: DC | PRN
  Administered 2016-01-25: 5 mL via EPIDURAL
  Administered 2016-01-25: 10 mL via EPIDURAL

## 2016-01-25 MED ORDER — MEPERIDINE HCL 25 MG/ML IJ SOLN: 6.2500 mg | INTRAMUSCULAR | Status: DC | PRN

## 2016-01-25 MED ORDER — PROMETHAZINE HCL 25 MG/ML IJ SOLN: 6.2500 mg | INTRAMUSCULAR | Status: DC | PRN

## 2016-01-25 MED ORDER — FAMOTIDINE 20 MG PO TABS
40.0000 mg | ORAL_TABLET | Freq: Once | ORAL | Status: AC
  Administered 2016-01-25: 40 mg via ORAL
  Filled 2016-01-25: qty 2

## 2016-01-25 MED ORDER — IBUPROFEN 600 MG PO TABS: 600.0000 mg | ORAL_TABLET | Freq: Four times a day (QID) | ORAL | Status: DC

## 2016-01-25 MED ORDER — DIPHENHYDRAMINE HCL 25 MG PO CAPS: 25.0000 mg | ORAL_CAPSULE | Freq: Four times a day (QID) | ORAL | Status: DC | PRN

## 2016-01-25 MED ORDER — WITCH HAZEL-GLYCERIN EX PADS: 1.0000 "application " | MEDICATED_PAD | CUTANEOUS | Status: DC | PRN

## 2016-01-25 MED ORDER — COCONUT OIL OIL
1.0000 "application " | TOPICAL_OIL | Status: DC | PRN
  Filled 2016-01-25: qty 120

## 2016-01-25 MED ORDER — ACETAMINOPHEN 325 MG PO TABS: 650.0000 mg | ORAL_TABLET | ORAL | Status: DC | PRN

## 2016-01-25 MED ORDER — COCONUT OIL OIL: 1.0000 "application " | TOPICAL_OIL | Status: DC | PRN

## 2016-01-25 MED ORDER — PRENATAL MULTIVITAMIN CH
1.0000 | ORAL_TABLET | Freq: Every day | ORAL | Status: DC
  Administered 2016-01-26 – 2016-01-27 (×2): 1 via ORAL
  Filled 2016-01-25 (×2): qty 1

## 2016-01-25 MED ORDER — SIMETHICONE 80 MG PO CHEW
80.0000 mg | CHEWABLE_TABLET | ORAL | Status: DC | PRN
Start: 2016-01-25 — End: 2016-01-27
  Administered 2016-01-26 – 2016-01-27 (×2): 80 mg via ORAL

## 2016-01-25 MED ORDER — KETOROLAC TROMETHAMINE 30 MG/ML IJ SOLN: 30.0000 mg | Freq: Once | INTRAMUSCULAR | Status: DC

## 2016-01-25 MED ORDER — SODIUM CHLORIDE 0.9 % IV SOLN
2.0000 g | Freq: Four times a day (QID) | INTRAVENOUS | Status: DC
  Administered 2016-01-25: 2 g via INTRAVENOUS
  Filled 2016-01-25 (×2): qty 2000

## 2016-01-25 MED ORDER — METOCLOPRAMIDE HCL 10 MG PO TABS
10.0000 mg | ORAL_TABLET | Freq: Once | ORAL | Status: AC
  Administered 2016-01-25: 10 mg via ORAL
  Filled 2016-01-25: qty 1

## 2016-01-25 MED ORDER — SIMETHICONE 80 MG PO CHEW: 80.0000 mg | CHEWABLE_TABLET | ORAL | Status: DC | PRN

## 2016-01-25 MED ORDER — LIDOCAINE-EPINEPHRINE (PF) 2 %-1:200000 IJ SOLN
INTRAMUSCULAR | Status: AC
  Filled 2016-01-25: qty 20

## 2016-01-25 MED ORDER — MIDAZOLAM HCL 2 MG/2ML IJ SOLN
INTRAMUSCULAR | Status: AC
  Filled 2016-01-25: qty 2

## 2016-01-25 MED ORDER — ONDANSETRON HCL 4 MG/2ML IJ SOLN
INTRAMUSCULAR | Status: DC | PRN
  Administered 2016-01-25: 4 mg via INTRAVENOUS

## 2016-01-25 MED ORDER — LACTATED RINGERS IV SOLN
INTRAVENOUS | Status: DC
  Administered 2016-01-25 (×3): via INTRAVENOUS

## 2016-01-25 MED ORDER — PRENATAL MULTIVITAMIN CH: 1.0000 | ORAL_TABLET | Freq: Every day | ORAL | Status: DC

## 2016-01-25 SURGICAL SUPPLY — 24 items
CONTAINER PREFILL 10% NBF 15ML (MISCELLANEOUS) ×6 IMPLANT
DRSG OPSITE POSTOP 3X4 (GAUZE/BANDAGES/DRESSINGS) ×3 IMPLANT
DURAPREP 26ML APPLICATOR (WOUND CARE) ×3 IMPLANT
GLOVE BIOGEL PI IND STRL 6.5 (GLOVE) ×1 IMPLANT
GLOVE BIOGEL PI IND STRL 7.0 (GLOVE) ×1 IMPLANT
GLOVE BIOGEL PI INDICATOR 6.5 (GLOVE) ×2
GLOVE BIOGEL PI INDICATOR 7.0 (GLOVE) ×2
GLOVE SURG SS PI 6.0 STRL IVOR (GLOVE) ×3 IMPLANT
GOWN STRL REUS W/TWL LRG LVL3 (GOWN DISPOSABLE) ×6 IMPLANT
NEEDLE HYPO 22GX1.5 SAFETY (NEEDLE) ×3 IMPLANT
NS IRRIG 1000ML POUR BTL (IV SOLUTION) ×3 IMPLANT
PACK ABDOMINAL MINOR (CUSTOM PROCEDURE TRAY) ×3 IMPLANT
PROTECTOR NERVE ULNAR (MISCELLANEOUS) ×6 IMPLANT
SPONGE GAUZE 2X2 8PLY STER LF (GAUZE/BANDAGES/DRESSINGS) ×1
SPONGE GAUZE 2X2 8PLY STRL LF (GAUZE/BANDAGES/DRESSINGS) ×2 IMPLANT
SPONGE LAP 4X18 X RAY DECT (DISPOSABLE) ×3 IMPLANT
SUT PLAIN 0 NONE (SUTURE) ×3 IMPLANT
SUT VIC AB 0 CT1 27 (SUTURE) ×2
SUT VIC AB 0 CT1 27XBRD ANBCTR (SUTURE) ×1 IMPLANT
SUT VIC AB 3-0 PS2 18 (SUTURE) ×3 IMPLANT
SYR CONTROL 10ML LL (SYRINGE) ×3 IMPLANT
TOWEL OR 17X24 6PK STRL BLUE (TOWEL DISPOSABLE) ×6 IMPLANT
TRAY FOLEY CATH SILVER 14FR (SET/KITS/TRAYS/PACK) ×3 IMPLANT
WATER STERILE IRR 1000ML POUR (IV SOLUTION) ×3 IMPLANT

## 2016-01-25 NOTE — Anesthesia Postprocedure Evaluation (Signed)
Anesthesia Post Note  Patient: Ebony Smith  Procedure(s) Performed: Procedure(s) (LRB): POST PARTUM TUBAL LIGATION (Bilateral)  Patient location during evaluation: PACU Anesthesia Type: Epidural Level of consciousness: awake Pain management: pain level controlled Vital Signs Assessment: post-procedure vital signs reviewed and stable Cardiovascular status: stable Postop Assessment: no headache, epidural receding, no backache and no signs of nausea or vomiting Anesthetic complications: no     Last Vitals:  Vitals:   01/25/16 1447 01/25/16 1500  BP: (!) 98/53 (!) 94/59  Pulse: 92 87  Resp: 16 17  Temp:      Last Pain:  Vitals:   01/25/16 1500  TempSrc:   PainSc: 0-No pain   Pain Goal: Patients Stated Pain Goal: 7 (01/24/16 1855)               Nekeya Briski JR,JOHN Susann Givens

## 2016-01-25 NOTE — Progress Notes (Signed)
35 y.o. yo [phone]  with undesired fertility,status post vaginal delivery, desires permanent sterilization. Risks and benefits of procedure discussed with patient including permanence of method, bleeding, infection, injury to surrounding organs and need for additional procedures. Risk failure of 0.5-1% with increased risk of ectopic gestation if pregnancy occurs was also discussed with patient.

## 2016-01-25 NOTE — Anesthesia Postprocedure Evaluation (Signed)
Anesthesia Post Note  Patient: Ebony Smith  Procedure(s) Performed: * No procedures listed *  Patient location during evaluation: Mother Baby Anesthesia Type: Epidural Level of consciousness: awake Pain management: satisfactory to patient Vital Signs Assessment: post-procedure vital signs reviewed and stable Respiratory status: spontaneous breathing Cardiovascular status: stable Anesthetic complications: no     Last Vitals:  Vitals:   01/25/16 1800 01/25/16 1935  BP: 98/60 95/67  Pulse: 87 88  Resp: 16 18  Temp: 37.2 C 36.6 C    Last Pain:  Vitals:   01/25/16 1935  TempSrc: Oral  PainSc: 0-No pain   Pain Goal: Patients Stated Pain Goal: 7 (01/24/16 1855)               Cephus Shelling

## 2016-01-25 NOTE — Transfer of Care (Signed)
Immediate Anesthesia Transfer of Care Note  Patient: Ebony Smith  Procedure(s) Performed: Procedure(s): POST PARTUM TUBAL LIGATION (Bilateral)  Patient Location: PACU  Anesthesia Type:Epidural  Level of Consciousness: awake, alert  and oriented  Airway & Oxygen Therapy: Patient Spontanous Breathing  Post-op Assessment: Report given to RN and Post -op Vital signs reviewed and stable  Post vital signs: Reviewed and stable  Last Vitals:  Vitals:   01/25/16 1200 01/25/16 1300  BP: 109/64 108/65  Pulse: 80 80  Resp: 18 18  Temp: 37.4 C 36.9 C    Last Pain:  Vitals:   01/25/16 1300  TempSrc: Oral  PainSc: 0-No pain      Patients Stated Pain Goal: 7 (01/24/16 1855)  Complications: No apparent anesthesia complications

## 2016-01-25 NOTE — Progress Notes (Signed)
Patient ID: Ebony Smith, female   DOB: 09/15/1980, 35 y.o.   MRN: [id number]  Starting to feel some pressure w/ epidural VSS, afeb FHR 140s, +accels, occ mi variables Ctx q 3-4 mins, spont Cx 9/90/-1; cx mostly on pt left side  IUP@term  TOLAC Protracted active phase, likely due to inadequate labor  IUPC inserted; will eval MVUs and begin Pit prn  Cam Hai CNM 01/25/2016 2:38 AM

## 2016-01-25 NOTE — Progress Notes (Signed)
ANTIBIOTIC CONSULT NOTE - INITIAL  Pharmacy Consult for Gentamicin Indication: Chorioamnionitis  No Known Allergies  Patient Measurements: Height: 5\' 1"  (154.9 cm) Weight: 158 lb (71.7 kg) IBW/kg (Calculated) : 47.8 Adjusted Body Weight: 55 Kg  Vital Signs: Temp: 101 F (38.3 C) (07/27 0533) Temp Source: Oral (07/27 0533) BP: 114/70 (07/27 0532) Pulse Rate: 115 (07/27 0532)  Labs:  Recent Labs  01/24/16 1710  WBC 20.2*  HGB 12.5  PLT 217   No results for input(s): GENTTROUGH, GENTPEAK, GENTRANDOM in the last 72 hours.   Microbiology: Recent Results (from the past 720 hour(s))  OB RESULT CONSOLE Group B Strep     Status: None   Collection Time: 12/29/15 12:00 AM  Result Value Ref Range Status   GBS Negative  Final    Medications:  Ampicillin 2 Gm IV every 6 hours  Assessment: 35 y.o. female G3P2002 at [redacted]w[redacted]d in labor; now with increased temperature and presumed chorioamnionitis  Estimated Ke = 0.256, Vd = 22.0 Liters  Goal of Therapy:  Gentamicin peak 6-8 mg/L and Trough < 1 mg/L  Plan:  Gentamicin 150 mg IV x 1  Gentamicin 130 mg IV every 8 hrs  Check Scr with next labs if gentamicin continued. Will check gentamicin levels if continued > 72hr or clinically indicated.  Ebony Smith 01/25/2016,5:58 AM

## 2016-01-25 NOTE — Progress Notes (Signed)
Assisted pt up to Br to void, voided 400 cc yellow urine. Peri care taught , ice pack applied. Pt tolerated ambulation well.

## 2016-01-25 NOTE — Progress Notes (Signed)
Patient ID: Ebony Smith, female   DOB: [birth year], 35 y.o.   MRN: [id number]  Feeling more pressure; had T 101 @ 0530- on Amp and Gent; got Tylenol; completely dilated x 1.5hrs and laboring down  BP stable FHR 150s, +accels, mi variables Ctx Q 2 mins w/ Pit @ 76mu/min Cx C/C/+1  IUP@term  TOLAC Triple I End 1st stage  Will begin pushing Anticipate SVD  Cam Hai CNM 01/25/2016 7:41 AM

## 2016-01-25 NOTE — Op Note (Signed)
PREOPERATIVE DIAGNOSIS:  Undesired fertility  POSTOPERATIVE DIAGNOSIS:  Undesired fertility  PROCEDURE:  Postpartum Bilateral Tubal Sterilization using Pomeroy method   ANESTHESIA:  Epidural  COMPLICATIONS:  None immediate.  ESTIMATED BLOOD LOSS:  Less than 20cc.  FLUIDS: 1000 cc LR.  URINE OUTPUT:  150 cc of clear urine.  INDICATIONS: 35 y.o. yo [id number]  with undesired fertility,status post vaginal delivery, desires permanent sterilization. Risks and benefits of procedure discussed with patient including permanence of method, bleeding, infection, injury to surrounding organs and need for additional procedures. Risk failure of 0.5-1% with increased risk of ectopic gestation if pregnancy occurs was also discussed with patient.   FINDINGS:  Normal uterus, tubes, and ovaries.  TECHNIQUE: After informed consent was obtained, the patient was taken to the operating room where anesthesia was induced and found to be adequate. A small transverse, infraumbilical skin incision was made with the scalpel. This incision was carried down to the underlying layer of fascia. The fascia was grasped with Kocher clamps tented up and entered sharply with Mayo scissors. Underlying peritoneum was then identified tented up and entered sharply with Metzenbaum scissors. The fascia was tagged with 0 Vicryl. The patient's left fallopian tube was then identified, brought to the incision, and grasped with a Babcock clamp. The tube was then followed out to the fimbria. The Babcock clamp was then used to grasp the tube approximately 4 cm from the cornual region. A 3 cm segment of the tube was then ligated with free tie of plain gut suture, transected and excised. Good hemostasis was noted and the tube was returned to the abdomen. The right fallopian tube was then identified to its fimbriated end, ligated, and a 3 cm segment excised in a similar fashion. Excellent hemostasis was noted, and the tube returned to the abdomen. The  fascia was re-approximated with 0 Vicryl. The skin was closed in a subcuticular fashion with 3-0 Vicryl. Quarter percent Marcaine solution was then injected at the incision site. The patient tolerated the procedure well. Sponge, lap, and needle count were correct x2. The patient was taken to recovery room in stable condition.

## 2016-01-25 NOTE — Anesthesia Preprocedure Evaluation (Signed)
Anesthesia Evaluation  Patient identified by MRN, date of birth, ID band Patient awake    Reviewed: Allergy & Precautions, NPO status , Patient's Chart, lab work & pertinent test results  Airway Mallampati: I  TM Distance: >3 FB Neck ROM: Full    Dental  (+) Teeth Intact, Dental Advisory Given   Pulmonary    breath sounds clear to auscultation       Cardiovascular  Rhythm:Regular Rate:Normal     Neuro/Psych    GI/Hepatic   Endo/Other    Renal/GU      Musculoskeletal   Abdominal   Peds  Hematology   Anesthesia Other Findings   Reproductive/Obstetrics                             Anesthesia Physical  Anesthesia Plan  ASA: II  Anesthesia Plan: Epidural   Post-op Pain Management:    Induction:   Airway Management Planned:   Additional Equipment:   Intra-op Plan:   Post-operative Plan:   Informed Consent: I have reviewed the patients History and Physical, chart, labs and discussed the procedure including the risks, benefits and alternatives for the proposed anesthesia with the patient or authorized representative who has indicated his/her understanding and acceptance.     Plan Discussed with: CRNA and Surgeon  Anesthesia Plan Comments: (For PPTL with labor epidural.)        Anesthesia Quick Evaluation

## 2016-01-26 NOTE — Progress Notes (Signed)
POSTPARTUM PROGRESS NOTE  Post Partum Day 1 POD#1 Subjective:  Ebony Smith is a 35 y.o. [id number] [redacted]w[redacted]d s/p VBAC with post partum BTL.  No acute events overnight.  Pt denies problems with ambulating, voiding or po intake.  She denies nausea or vomiting.  Pain is moderately controlled.  She has had flatus. She has not had bowel movement.  Lochia Small.   Objective: Blood pressure (!) 84/53, pulse 74, temperature 98.5 F (36.9 C), temperature source Axillary, resp. rate 20, height 5\' 1"  (1.549 m), weight 158 lb (71.7 kg), SpO2 98 %, unknown if currently breastfeeding.  Physical Exam:  General: alert, cooperative and no distress Lochia:normal flow Chest: CTAB Heart: RRR no m/r/g Abdomen: +BS, soft, nontender, incision with dressing intact Uterine Fundus: firm, minimally tender DVT Evaluation: No calf swelling or tenderness Extremities: trace edema   Recent Labs  01/24/16 1710  HGB 12.5  HCT 36.2    Assessment/Plan:  ASSESSMENT: Vickey Solesbee is a 35 y.o. B1D1761 [redacted]w[redacted]d s/p VBAC with post partum BTL  Plan for discharge tomorrow   LOS: 2 days   Ernestina Penna 01/26/2016, 9:15 AM

## 2016-01-26 NOTE — Lactation Note (Signed)
This note was copied from a baby's chart. Lactation Consultation Note  Patient Name: Ebony Smith OZHYQ'M Date: 01/26/2016 Reason for consult: Initial assessment Baby at 29 hr of life. Mom is breast and formula feeding by her choice because she reports low milk supply with all of her children in the 1st week. She bf her oldest 1 yr and her middle child 2 yr with no other issues. She declined pump, she reports that no milk comes out when she pumps but she can see the baby swallowing when he goes to breast. She denies breast or nipple pain, voiced no concerns. Discussed baby behavior, feeding frequency, baby belly size, voids, wt loss, breast changes, and nipple care. She stated she can manually express. Given lactation handouts. Aware of OP services and support group.     Maternal Data Has patient been taught Hand Expression?: Yes Does the patient have breastfeeding experience prior to this delivery?: Yes  Feeding Length of feed: 30 min  LATCH Score/Interventions                      Lactation Tools Discussed/Used WIC Program: Yes   Consult Status Consult Status: Follow-up Date: 01/27/16 Follow-up type: In-patient    Rulon Eisenmenger 01/26/2016, 3:33 PM

## 2016-01-27 MED ORDER — SENNOSIDES-DOCUSATE SODIUM 8.6-50 MG PO TABS: 2.0000 | ORAL_TABLET | Freq: Every evening | ORAL | 0 refills | Status: AC | PRN

## 2016-01-27 MED ORDER — IBUPROFEN 600 MG PO TABS: 600.0000 mg | ORAL_TABLET | Freq: Four times a day (QID) | ORAL | 0 refills | Status: AC

## 2016-01-27 MED ORDER — ACETAMINOPHEN 325 MG PO TABS: 650.0000 mg | ORAL_TABLET | ORAL | 1 refills | Status: AC | PRN

## 2016-01-27 NOTE — Lactation Note (Signed)
This note was copied from a baby's chart. Lactation Consultation Note  Mother reports nipple tenderness. Her nipples are large and the baby's mouth is small.  Reviewed positioning and deeper latch.  She reports that when he attaches to the breast he has long suckles. He was observed chewing at this consult and did not maintain latch.  He is also receiving formula because mother has "no milk".  Reviewed hand expression without yield and initiated manual pump with #27 flange. A few drops were obtained Explained to mother that pumping after BF would bring her milk to volume quicker. Offered [facility] loaner but it was declined. Aware of support groups and outpatient services.  Patient Name: Ebony Smith KCMKL'K Date: 01/27/2016 Reason for consult: Follow-up assessment   Maternal Data    Feeding Feeding Type: Breast Fed Length of feed: 30 min  LATCH Score/Interventions Latch: Repeated attempts needed to sustain latch, nipple held in mouth throughout feeding, stimulation needed to elicit sucking reflex.  Audible Swallowing: None  Type of Nipple: Everted at rest and after stimulation (large)  Comfort (Breast/Nipple): Filling, red/small blisters or bruises, mild/mod discomfort  Problem noted: Mild/Moderate discomfort Interventions (Mild/moderate discomfort):  (coconut oil)  Hold (Positioning): Assistance needed to correctly position infant at breast and maintain latch.  LATCH Score: 5  Lactation Tools Discussed/Used     Consult Status Consult Status: Complete    Soyla Dryer 01/27/2016, 9:21 AM

## 2016-01-27 NOTE — Discharge Instructions (Signed)
Postpartum Tubal Ligation, Care After °Refer to this sheet in the next few weeks. These instructions provide you with information about caring for yourself after your procedure. Your health care provider may also give you more specific instructions. Your treatment has been planned according to current medical practices, but problems sometimes occur. Call your health care provider if you have any problems or questions after your procedure. °WHAT TO EXPECT AFTER THE PROCEDURE °After your procedure, it is common to have: °· Sore throat. °· Soreness at the incision site. °· Mild cramping. °· Tiredness. °· Mild nausea or vomiting. °HOME CARE INSTRUCTIONS °· Rest for the remainder of the day. °· Take medicines only as directed by your health care provider. These include over-the-counter medicines and prescription medicines. Do not take aspirin, which can cause bleeding. °· Over the next few days, gradually return to your normal activities and your normal diet. °· Avoid sexual intercourse for 2 weeks or as directed by your health care provider. °· Do not drive or operate heavy machinery while taking pain medicine. °· Do not lift anything that is heavier than 5 lb (2.3 kg) for 2 weeks or as directed by your health care provider. °· Do not take baths. Take showers only. Ask your health care provider when you can start taking baths. °· Take your temperature twice each day and write it down. °· Try to have help for the first 7-10 days for your household needs. °· There are many different ways to close and cover an incision, including stitches (sutures), skin glue, and adhesive strips. Follow instructions from your health care provider about: °¨ Incision care. °¨ Bandage (dressing) changes and removal. °¨ Incision closure removal. °· Check your incision area every day for signs of infection. Watch for: °¨ Redness, swelling, or pain. °¨ Fluid, blood, or pus. °· Keep all follow-up visits as directed by your health care  provider. °SEEK MEDICAL CARE IF: °· You have redness, swelling, or increasing pain in your incision area. °· You have fluid or pus coming from your incision for longer than 1 day. °· You notice a bad smell coming from your incision or your dressing. °· The edges of your incision break open after the sutures have been removed. °· Your pain does not decrease after 2-3 days. °· You have a rash. °· You repeatedly become dizzy or light-headed. °· You have a reaction to your medicine. °· Your pain medicine is not helping. °· You are constipated. °SEEK IMMEDIATE MEDICAL CARE IF:  °· You have a fever. °· You faint. °· You have increasing pain in your abdomen. °· You have bleeding or drainage from your suture sites or your vagina after surgery. °· You have shortness of breath or have difficulty breathing. °· You have chest pain or leg pain. °· You have ongoing nausea, vomiting, or diarrhea. °  °This information is not intended to replace advice given to you by your health care provider. Make sure you discuss any questions you have with your health care provider. °  °Document Released: 12/17/2011 Document Revised: 11/01/2014 Document Reviewed: 12/17/2011 °Elsevier Interactive Patient Education ©2016 [employer] °Postpartum Care After Vaginal Delivery °After you deliver your newborn (postpartum period), the usual stay in the hospital is 24-72 hours. If there were problems with your labor or delivery, or if you have other medical problems, you might be in the hospital longer.  °While you are in the hospital, you will receive help and instructions on how to care for yourself and your   newborn during the postpartum period.  °While you are in the hospital: °· Be sure to tell your nurses if you have pain or discomfort, as well as where you feel the pain and what makes the pain worse. °· If you had an incision made near your vagina (episiotomy) or if you had some tearing during delivery, the nurses may put ice packs on your  episiotomy or tear. The ice packs may help to reduce the pain and swelling. °· If you are breastfeeding, you may feel uncomfortable contractions of your uterus for a couple of weeks. This is normal. The contractions help your uterus get back to normal size. °· It is normal to have some bleeding after delivery. °¨ For the first 1-3 days after delivery, the flow is red and the amount may be similar to a period. °¨ It is common for the flow to start and stop. °¨ In the first few days, you may pass some small clots. Let your nurses know if you begin to pass large clots or your flow increases. °¨ Do not  flush blood clots down the toilet before having the nurse look at them. °¨ During the next 3-10 days after delivery, your flow should become more watery and pink or brown-tinged in color. °¨ Ten to fourteen days after delivery, your flow should be a small amount of yellowish-white discharge. °¨ The amount of your flow will decrease over the first few weeks after delivery. Your flow may stop in 6-8 weeks. Most women have had their flow stop by 12 weeks after delivery. °· You should change your sanitary pads frequently. °· Wash your hands thoroughly with soap and water for at least 20 seconds after changing pads, using the toilet, or before holding or feeding your newborn. °· You should feel like you need to empty your bladder within the first 6-8 hours after delivery. °· In case you become weak, lightheaded, or faint, call your nurse before you get out of bed for the first time and before you take a shower for the first time. °· Within the first few days after delivery, your breasts may begin to feel tender and full. This is called engorgement. Breast tenderness usually goes away within 48-72 hours after engorgement occurs. You may also notice milk leaking from your breasts. If you are not breastfeeding, do not stimulate your breasts. Breast stimulation can make your breasts produce more milk. °· Spending as much time as  possible with your newborn is very important. During this time, you and your newborn can feel close and get to know each other. Having your newborn stay in your room (rooming in) will help to strengthen the bond with your newborn.  It will give you time to get to know your newborn and become comfortable caring for your newborn. °· Your hormones change after delivery. Sometimes the hormone changes can temporarily cause you to feel sad or tearful. These feelings should not last more than a few days. If these feelings last longer than that, you should talk to your caregiver. °· If desired, talk to your caregiver about methods of family planning or contraception. °· Talk to your caregiver about immunizations. Your caregiver may want you to have the following immunizations before leaving the hospital: °¨ Tetanus, diphtheria, and pertussis (Tdap) or tetanus and diphtheria (Td) immunization. It is very important that you and your family (including grandparents) or others caring for your newborn are up-to-date with the Tdap or Td immunizations. The Tdap or Td immunization can   help protect your newborn from getting ill. °¨ Rubella immunization. °¨ Varicella (chickenpox) immunization. °¨ Influenza immunization. You should receive this annual immunization if you did not receive the immunization during your pregnancy. °  °This information is not intended to replace advice given to you by your health care provider. Make sure you discuss any questions you have with your health care provider. °  °Document Released: 04/14/2007 Document Revised: 03/11/2012 Document Reviewed: 02/12/2012 °Elsevier Interactive Patient Education ©2016 Elsevier Inc. ° °

## 2016-01-27 NOTE — Discharge Summary (Signed)
OB Discharge Summary     Patient Name: Ebony Smith DOB: 1980/09/02 MRN: [id number]  Date of admission: 01/24/2016 Delivering MD: Sharen Counter A   Date of discharge: 01/27/2016  Admitting diagnosis: 40w possible labor desires sterlization Intrauterine pregnancy: [redacted]w[redacted]d     Secondary diagnosis:  Active Problems:   Active labor   VBAC, delivered, current hospitalization   Encounter for sterilization  Additional problems: Mild shoulder dystocia     Discharge diagnosis: Term Pregnancy Delivered and VBAC ; Sterilization with bilateral tubal ligation.                                                                                              Post partum procedures:None   Augmentation: AROM and Pitocin  Complications: Mild shoulder dystocia- successful first maneuver with [facility] course:  Onset of Labor With Vaginal Delivery     35 y.o. yo G3P3003 at [redacted]w[redacted]d was admitted in Active Labor on 01/24/2016. Patient had an uncomplicated labor course as follows:  Membrane Rupture Time/Date: 11:19 PM ,01/24/2016   Intrapartum Procedures: Episiotomy:   None                                        Lacerations:  2nd degree [3];Perineal [11]         Mild shoulder dystocia, single maneuver successful with [facility] Patient had a delivery of a Viable infant. 01/25/2016  Information for the patient's newborn:  Amatullah, Christy [[id number]]  Delivery Method: Vag-Spont    Pateint had an uncomplicated postpartum course. On PPD#0 patient underwent a postpartum tubal ligation, please see op note for details. Recovery went well. Incision was clean, dry, intact upon discharge. She is ambulating, tolerating a regular diet, passing flatus, and urinating well. Patient is discharged home in stable condition on 01/27/16.    Physical exam Vitals:   01/26/16 0821 01/26/16 1300 01/26/16 1856 01/27/16 0612  BP: (!) 84/53 (!) 87/56 102/67 115/71  Pulse: 74 86 73 65  Resp: Temp: 98.5 F (36.9 C) 98.6 F (37 C) 98.1 F (36.7 C) 97.8 F (36.6 C)  TempSrc: Axillary Oral Oral Oral  SpO2: 98% 97%    Weight:      Height:       General: alert, cooperative and no distress Lochia: appropriate Uterine Fundus: firm, below umbilicus Incision: N/A DVT Evaluation: No evidence of DVT seen on physical exam. Negative Homan's sign. No cords or calf tenderness. Calf/Ankle edema is present, mild and non-pitting Labs: Lab Results  Component Value Date   WBC 20.2 (H) 01/24/2016   HGB 12.5 01/24/2016   HCT 36.2 01/24/2016   MCV 86.0 01/24/2016   PLT 217 01/24/2016   No flowsheet data found.  Discharge instruction: per After Visit Summary and "Baby and Me Booklet".  After visit meds:    Medication List    STOP taking these medications   oseltamivir 75 MG capsule Commonly known as:  TAMIFLU     TAKE these medications  acetaminophen 325 MG tablet Commonly known as:  TYLENOL Take 2 tablets (650 mg total) by mouth every 4 (four) hours as needed (for pain scale < 4).   ibuprofen 600 MG tablet Commonly known as:  ADVIL,MOTRIN Take 1 tablet (600 mg total) by mouth every 6 (six) hours.   prenatal multivitamin Tabs tablet Take 1 tablet by mouth at bedtime.   senna-docusate 8.6-50 MG tablet Commonly known as:  Senokot-S Take 2 tablets by mouth at bedtime as needed for mild constipation.       Diet: routine diet  Activity: Advance as tolerated. Pelvic rest for 6 weeks.   Outpatient follow up:6 weeks Follow up Appt:No future appointments. Follow up Visit:No Follow-up on file.  Postpartum contraception: Tubal Ligation performed during hospital stay.  Newborn Data: Live born female  Birth Weight: 8 lb 5.2 oz (3775 g) APGAR: 4, 7  Baby Feeding: Breast and bottle Disposition:home with mother   01/27/2016 Jen Mow, DO

## 2016-01-29 ENCOUNTER — Encounter (HOSPITAL_COMMUNITY): Payer: Self-pay | Admitting: Obstetrics and Gynecology

## 2016-03-26 ENCOUNTER — Encounter: Payer: Self-pay | Admitting: *Deleted

## 2016-04-19 ENCOUNTER — Encounter: Payer: Self-pay | Admitting: *Deleted

## 2016-07-24 ENCOUNTER — Encounter: Payer: Self-pay | Admitting: *Deleted

## 2016-08-23 ENCOUNTER — Encounter (HOSPITAL_COMMUNITY): Payer: Self-pay

## 2018-11-29 ENCOUNTER — Emergency Department (HOSPITAL_COMMUNITY)
Admission: EM | Admit: 2018-11-29 | Discharge: 2018-11-29 | Disposition: A | Discharge status: Home / Self Care | Payer: No Typology Code available for payment source | Attending: Emergency Medicine | Admitting: Emergency Medicine

## 2018-11-29 ENCOUNTER — Other Ambulatory Visit: Payer: Self-pay

## 2018-11-29 ENCOUNTER — Encounter (HOSPITAL_COMMUNITY): Payer: Self-pay | Admitting: Emergency Medicine

## 2018-11-29 ENCOUNTER — Emergency Department (HOSPITAL_COMMUNITY): Payer: No Typology Code available for payment source

## 2018-11-29 DIAGNOSIS — U071 COVID-19: Secondary | ICD-10-CM | POA: Diagnosis not present

## 2018-11-29 DIAGNOSIS — J1289 Other viral pneumonia: Secondary | ICD-10-CM | POA: Diagnosis not present

## 2018-11-29 DIAGNOSIS — J189 Pneumonia, unspecified organism: Secondary | ICD-10-CM

## 2018-11-29 DIAGNOSIS — R509 Fever, unspecified: Secondary | ICD-10-CM | POA: Diagnosis present

## 2018-11-29 DIAGNOSIS — Z79899 Other long term (current) drug therapy: Secondary | ICD-10-CM | POA: Insufficient documentation

## 2018-11-29 DIAGNOSIS — R05 Cough: Secondary | ICD-10-CM | POA: Insufficient documentation

## 2018-11-29 LAB — CBC WITH DIFFERENTIAL/PLATELET
Abs Immature Granulocytes: 0.02 10*3/uL (ref 0.00–0.07)
Basophils Absolute: 0 10*3/uL (ref 0.0–0.1)
Basophils Relative: 0 %
Eosinophils Absolute: 0 10*3/uL (ref 0.0–0.5)
Eosinophils Relative: 0 %
HCT: 37.3 % (ref 36.0–46.0)
Hemoglobin: 11.9 g/dL — ABNORMAL LOW (ref 12.0–15.0)
Immature Granulocytes: 0 %
Lymphocytes Relative: 19 %
Lymphs Abs: 1.1 10*3/uL (ref 0.7–4.0)
MCH: 27.7 pg (ref 26.0–34.0)
MCHC: 31.9 g/dL (ref 30.0–36.0)
MCV: 86.7 fL (ref 80.0–100.0)
Monocytes Absolute: 0.3 10*3/uL (ref 0.1–1.0)
Monocytes Relative: 4 %
Neutro Abs: 4.6 10*3/uL (ref 1.7–7.7)
Neutrophils Relative %: 77 %
Platelets: 208 10*3/uL (ref 150–400)
RBC: 4.3 MIL/uL (ref 3.87–5.11)
RDW: 14.3 % (ref 11.5–15.5)
WBC: 6 10*3/uL (ref 4.0–10.5)
nRBC: 0 % (ref 0.0–0.2)

## 2018-11-29 LAB — I-STAT BETA HCG BLOOD, ED (MC, WL, AP ONLY): I-stat hCG, quantitative: <5 m[IU]/mL (ref <5)

## 2018-11-29 LAB — BASIC METABOLIC PANEL
Anion gap: 11 (ref 5–15)
BUN: 9 mg/dL (ref 6–20)
CO2: 23 mmol/L (ref 22–32)
Calcium: 8.7 mg/dL — ABNORMAL LOW (ref 8.9–10.3)
Chloride: 103 mmol/L (ref 98–111)
Creatinine, Ser: 0.95 mg/dL (ref 0.44–1.00)
GFR calc Af Amer: >60 mL/min (ref >60)
GFR calc non Af Amer: >60 mL/min (ref >60)
Glucose, Bld: 103 mg/dL — ABNORMAL HIGH (ref 70–99)
Potassium: 3.8 mmol/L (ref 3.5–5.1)
Sodium: 137 mmol/L (ref 135–145)

## 2018-11-29 MED ORDER — BENZONATATE 100 MG PO CAPS: 100.0000 mg | ORAL_CAPSULE | Freq: Three times a day (TID) | ORAL | 0 refills | Status: AC

## 2018-11-29 MED ORDER — SODIUM CHLORIDE 0.9 % IV BOLUS
1000.0000 mL | Freq: Once | INTRAVENOUS | Status: AC
  Administered 2018-11-29: 1000 mL via INTRAVENOUS

## 2018-11-29 MED ORDER — ONDANSETRON 4 MG PO TBDP: 4.0000 mg | ORAL_TABLET | Freq: Three times a day (TID) | ORAL | 0 refills | Status: AC | PRN

## 2018-11-29 MED ORDER — AZITHROMYCIN 250 MG PO TABS: 250.0000 mg | ORAL_TABLET | Freq: Every day | ORAL | 0 refills | Status: AC

## 2018-11-29 MED ORDER — ALBUTEROL SULFATE HFA 108 (90 BASE) MCG/ACT IN AERS: 2.0000 | INHALATION_SPRAY | RESPIRATORY_TRACT | 0 refills | Status: AC | PRN

## 2018-11-29 NOTE — ED Provider Notes (Signed)
[facility] EMERGENCY DEPARTMENT Provider Note   CSN: [id number] Arrival date & time: 11/29/18  4098    History   Chief Complaint Chief Complaint  Patient presents with  . Fever  . Cough    HPI Ebony Smith is a 38 y.o. female.     HPI   Ebony Smith is a 38 y.o. female, patient with no pertinent past medical history, presenting to the ED with fever and cough for the last week.  T-max 102 F.  Cough is productive with white sputum.  She has been taking Tylenol for her fever.  She presents to the ED because her "fever keeps coming back."  She has had a couple episodes of posttussive emesis. 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  She had a negative COVID 19 test May 21, however, she did not have symptoms at that time.  She denies shortness of breath, chest pain, abdominal pain, dizziness, hemoptysis, lower extremity swelling, nausea, diarrhea, or any other complaints.   Past Medical History:  Diagnosis Date  . Medical history non-contributory   . No pertinent past medical history     Patient Active Problem List   Diagnosis Date Noted  . VBAC, delivered, current hospitalization 01/25/2016  . Encounter for sterilization   . Active labor 01/24/2016    Past Surgical History:  Procedure Laterality Date  . CESAREAN SECTION    . TUBAL LIGATION Bilateral 01/25/2016   Procedure: POST PARTUM TUBAL LIGATION;  Surgeon: Catalina Antigua, MD;  Location: [facility];  Service: Gynecology;  Laterality: Bilateral;     OB History    Gravida  3   Para  3   Term  3   Preterm  0   AB  0   Living  3     SAB  0   TAB  0   Ectopic  0   Multiple  0   Live Births  3            Home Medications    Prior to Admission medications   Medication Sig Start Date End Date Taking? Authorizing Provider  acetaminophen (TYLENOL) 325 MG tablet Take 2 tablets  (650 mg total) by mouth every 4 (four) hours as needed (for pain scale < 4). 01/27/16  Yes Mumaw, Hiram Comber, DO  hydroquinone 4 % cream Apply 1 application topically at bedtime. Apply thin layer to affected area nightly 10/14/18  Yes [provider]  ibuprofen (ADVIL) 200 MG tablet Take 400 mg by mouth every 6 (six) hours as needed for moderate pain.   Yes [provider]  Multiple Vitamins-Minerals (MULTIVITAMIN WITH MINERALS) tablet Take 1 tablet by mouth daily.   Yes [provider]  albuterol (VENTOLIN HFA) 108 (90 Base) MCG/ACT inhaler Inhale 2 puffs into the lungs every 4 (four) hours as needed for wheezing or shortness of breath. 11/29/18   Joy, Shawn C, PA-C  azithromycin (ZITHROMAX) 250 MG tablet Take 1 tablet (250 mg total) by mouth daily. Take first 2 tablets together, then 1 every day until finished. 11/29/18   Joy, Shawn C, PA-C  benzonatate (TESSALON) 100 MG capsule Take 1 capsule (100 mg total) by mouth every 8 (eight) hours. 11/29/18   Joy, Shawn C, PA-C  ibuprofen (ADVIL,MOTRIN) 600 MG tablet Take 1 tablet (600 mg total) by mouth every 6 (six) hours. Patient not taking:  Reported on 11/29/2018 01/27/16   Michaele Offer, DO  ondansetron (ZOFRAN ODT) 4 MG disintegrating tablet Take 1 tablet (4 mg total) by mouth every 8 (eight) hours as needed for nausea or vomiting. 11/29/18   Joy, Shawn C, PA-C  senna-docusate (SENOKOT-S) 8.6-50 MG tablet Take 2 tablets by mouth at bedtime as needed for mild constipation. Patient not taking: Reported on 11/29/2018 01/27/16   Michaele Offer, DO    Family History Family History  Problem Relation Age of Onset  . Alcohol abuse Neg Hx   . Asthma Neg Hx   . Arthritis Neg Hx   . Birth defects Neg Hx   . Cancer Neg Hx   . COPD Neg Hx   . Depression Neg Hx   . Diabetes Neg Hx   . Drug abuse Neg Hx   . Early death Neg Hx   . Hearing loss Neg Hx   . Heart disease Neg Hx   . Hyperlipidemia Neg Hx   .  Hypertension Neg Hx   . Kidney disease Neg Hx   . Learning disabilities Neg Hx   . Mental illness Neg Hx   . Mental retardation Neg Hx   . Miscarriages / Stillbirths Neg Hx   . Stroke Neg Hx   . Vision loss Neg Hx     Social History Social History   Tobacco Use  . Smoking status: Never Smoker  . Smokeless tobacco: Never Used  Substance Use Topics  . Alcohol use: No  . Drug use: No     Allergies   Patient has no known allergies.   Review of Systems Review of Systems  Constitutional: Positive for fever.  Respiratory: Positive for cough. Negative for shortness of breath.   Cardiovascular: Negative for chest pain, palpitations and leg swelling.  Gastrointestinal: Positive for vomiting (post tussive). Negative for abdominal pain, blood in stool, diarrhea and nausea.  Neurological: Negative for dizziness and syncope.  All other systems reviewed and are negative.    Physical Exam Updated Vital Signs BP 100/73   Pulse (!) 105   Temp (!) 101.1 F (38.4 C) (Oral)   Resp 18   Ht  (1.575 m)   Wt 71.7 kg   SpO2 98%   BMI 28.91 kg/m   Physical Exam Vitals signs and nursing note reviewed.  Constitutional:      General: She is not in acute distress.    Appearance: She is well-developed. She is not diaphoretic.  HENT:     Head: Normocephalic and atraumatic.     Mouth/Throat:     Mouth: Mucous membranes are moist.     Pharynx: Oropharynx is clear.  Eyes:     Conjunctiva/sclera: Conjunctivae normal.  Neck:     Musculoskeletal: Neck supple.  Cardiovascular:     Rate and Rhythm: Regular rhythm. Tachycardia present.     Pulses: Normal pulses.          Radial pulses are 2+ on the right side and 2+ on the left side.       Posterior tibial pulses are 2+ on the right side and 2+ on the left side.     Heart sounds: Normal heart sounds.     Comments: Tactile temperature in the extremities appropriate and equal bilaterally. Mildly tachycardic. Pulmonary:     Effort:  Pulmonary effort is normal. No respiratory distress.     Breath sounds: Normal breath sounds.     Comments: No increased work of breathing.  Speaks  in full sentences without difficulty. Abdominal:     Palpations: Abdomen is soft.     Tenderness: There is no abdominal tenderness. There is no guarding.  Musculoskeletal:     Right lower leg: No edema.     Left lower leg: No edema.  Lymphadenopathy:     Cervical: No cervical adenopathy.  Skin:    General: Skin is warm and dry.  Neurological:     Mental Status: She is alert.  Psychiatric:        Mood and Affect: Mood and affect normal.        Speech: Speech normal.        Behavior: Behavior normal.      ED Treatments / Results  Labs (all labs ordered are listed, but only abnormal results are displayed) Labs Reviewed  CBC WITH DIFFERENTIAL/PLATELET - Abnormal; Notable for the following components:      Result Value   Hemoglobin 11.9 (*)    All other components within normal limits  BASIC METABOLIC PANEL - Abnormal; Notable for the following components:   Glucose, Bld 103 (*)    Calcium 8.7 (*)    All other components within normal limits  NOVEL CORONAVIRUS, NAA (HOSPITAL ORDER, SEND-OUT TO REF LAB)  I-STAT BETA HCG BLOOD, ED ([facility], [facility], [facility] ONLY)    EKG None  Radiology Dg Chest Portable 1 View  Result Date: 11/29/2018 CLINICAL DATA:  Cough, fever EXAM: PORTABLE CHEST 1 VIEW COMPARISON:  07/25/2010 FINDINGS: Mild lingular opacity. Mild patchy opacity in the lateral right mid lung. These findings are suspicious for pneumonia. No pleural effusion or pneumothorax. The heart is normal in size. IMPRESSION: Mild patchy bilateral pulmonary opacities, suspicious for multifocal pneumonia. Electronically Signed   By: Charline Bills M.D.   On: 11/29/2018 07:54    Procedures Procedures (including critical care time)  Medications Ordered in ED Medications  sodium chloride 0.9 % bolus 1,000 mL (0 mLs Intravenous Stopped 11/29/18 0847)      Initial Impression / Assessment and Plan / ED Course  I have reviewed the triage vital signs and the nursing notes.  Pertinent labs & imaging results that were available during my care of the patient were reviewed by me and considered in my medical decision making (see chart for details).        Patient presents with cough and fever. Patient is nontoxic appearing, not tachypneic, not hypotensive, maintains excellent SPO2 on room air, and is in no apparent distress.  Febrile and mildly tachycardic.  I have a low suspicion for sepsis in this patient.  Findings of mild bilateral opacities consistent with multifocal pneumonia noted on chest x-ray.  COVID-19 infection is a possibility.  Those test results are currently pending. After we discussed her fever and the continued expectation for fever during her illness, she states she is comfortable managing her fever at home.  Findings and plan of care discussed with Gwyneth Sprout, MD.   Vitals:   11/29/18 0725 11/29/18 0730 11/29/18 0815 11/29/18 0845  BP:  101/74 103/73 100/73  Pulse:  (!) 109 (!) 104 (!) 105  Resp:    18  Temp:      TempSrc:      SpO2:  98% 97% 98%  Weight: 71.7 kg     Height: 5\' 2"  (1.575 m)       Ebony Smith was evaluated in Emergency Department on 11/29/2018 for the symptoms described in the history of present illness. She was evaluated in the context of  the global COVID-19 pandemic, which necessitated consideration that the patient might be at risk for infection with the SARS-CoV-2 virus that causes COVID-19. Institutional protocols and algorithms that pertain to the evaluation of patients at risk for COVID-19 are in a state of rapid change based on information released by regulatory bodies including the CDC and federal and state organizations. These policies and algorithms were followed during the patient's care in the ED.  Final Clinical Impressions(s) / ED Diagnoses   Final diagnoses:  Multifocal  pneumonia    ED Discharge Orders         Ordered    azithromycin (ZITHROMAX) 250 MG tablet  Daily     11/29/18 0817    albuterol (VENTOLIN HFA) 108 (90 Base) MCG/ACT inhaler  Every 4 hours PRN     11/29/18 0817    benzonatate (TESSALON) 100 MG capsule  Every 8 hours     11/29/18 0817    ondansetron (ZOFRAN ODT) 4 MG disintegrating tablet  Every 8 hours PRN     11/29/18 0856           Anselm PancoastJoy, Shawn C, PA-C 11/29/18 0902    Gwyneth SproutPlunkett, Whitney, MD 11/30/18 2217

## 2018-11-29 NOTE — Discharge Instructions (Addendum)
Patients who have symptoms consistent with COVID-19 should self isolated for: At least 3 days (72 hours) have passed since recovery, defined as resolution of fever without the use of fever reducing medications and improvement in respiratory symptoms (e.g., cough, shortness of breath), and At least 7 days have passed since symptoms first appeared.  Your symptoms are likely consistent with a viral illness. Viruses do not require or respond to antibiotics, however, since there is evidence of pneumonia on the chest xray, we will start you on an antibiotic in the event that it is caused by bacteria.  Otherwise, treatment is symptomatic care and it is important to note that these symptoms may last for 7-14 days.   Hand washing: Wash your hands throughout the day, but especially before and after touching the face, using the restroom, sneezing, coughing, or touching surfaces that have been coughed or sneezed upon. Hydration: Symptoms of most illnesses will be intensified and complicated by dehydration. Dehydration can also extend the duration of symptoms. Drink plenty of fluids and get plenty of rest. You should be drinking at least half a liter of water an hour to stay hydrated. Electrolyte drinks (ex. Gatorade, Powerade, Pedialyte) are also encouraged. You should be drinking enough fluids to make your urine light yellow, almost clear. If this is not the case, you are not drinking enough water. Please note that some of the treatments indicated below will not be effective if you are not adequately hydrated. Pain or fever: Ibuprofen, Naproxen, or acetaminophen (generic for Tylenol) for pain or fever.  Antiinflammatory medications: Take 600 mg of ibuprofen every 6 hours or 440 mg (over the counter dose) to 500 mg (prescription dose) of naproxen every 12 hours for the next 3 days. After this time, these medications may be used as needed for pain. Take these medications with food to avoid upset stomach. Choose  only one of these medications, do not take them together. Acetaminophen (generic for Tylenol): Should you continue to have additional pain while taking the ibuprofen or naproxen, you may add in acetaminophen as needed. Your daily total maximum amount of acetaminophen from all sources should be limited to 4000mg /day for persons without liver problems, or 2000mg /day for those with liver problems. Nausea/vomiting: Use the ondansetron (generic for Zofran) for nausea or vomiting.  This medication may not prevent all vomiting or nausea, but can help facilitate better hydration. Things that can help with nausea/vomiting also include peppermint/menthol candies, vitamin B12, and ginger. Cough: Use the benzonatate (generic for Tessalon) for cough.  Teas, warm liquids, broths, and honey can also help with cough. Albuterol: May use the albuterol as needed for instances of shortness of breath. Zyrtec or Claritin: May add these medication daily to control underlying symptoms of congestion, sneezing, and other signs of allergies.  These medications are available over-the-counter. Generics: Cetirizine (generic for Zyrtec) and loratadine (generic for Claritin). Fluticasone: Use fluticasone (generic for Flonase), as directed, for nasal and sinus congestion.  This medication is available over-the-counter. Congestion: Plain guaifenesin (generic for plain Mucinex) may help relieve congestion. Saline sinus rinses and saline nasal sprays may also help relieve congestion. If you do not have high blood pressure, heart problems, or an allergy to such medications, you may also try phenylephrine or Sudafed. Sore throat: Warm liquids or Chloraseptic spray may help soothe a sore throat. Gargle twice a day with a salt water solution made from a half teaspoon of salt in a cup of warm water.  Follow up: Follow up with  a primary care provider within the next two weeks should symptoms fail to resolve. Return: Return to the ED for  significantly worsening symptoms, shortness of breath, chest pain, persistent vomiting, large amounts of blood in stool, or any other major concerns.  For prescription assistance, may try using prescription discount sites or apps, such as goodrx.com

## 2018-11-29 NOTE — ED Triage Notes (Signed)
Pt in with c/o cough and fever since last week. States she tested negative for Covid last Thursday

## 2018-11-30 LAB — NOVEL CORONAVIRUS, NAA (HOSP ORDER, SEND-OUT TO REF LAB; TAT 18-24 HRS): SARS-CoV-2, NAA: DETECTED — AB

## 2018-12-01 ENCOUNTER — Telehealth (HOSPITAL_COMMUNITY): Payer: Self-pay

## 2019-09-03 ENCOUNTER — Ambulatory Visit
Admission: RE | Admit: 2019-09-03 | Discharge: 2019-09-03 | Disposition: A | Discharge status: Home / Self Care | Payer: PRIVATE HEALTH INSURANCE | Source: Ambulatory Visit | Attending: Internal Medicine | Admitting: Internal Medicine

## 2019-09-03 ENCOUNTER — Other Ambulatory Visit: Payer: Self-pay | Admitting: Internal Medicine

## 2019-09-03 ENCOUNTER — Other Ambulatory Visit: Payer: Self-pay | Admitting: Family Medicine

## 2019-09-03 DIAGNOSIS — R7611 Nonspecific reaction to tuberculin skin test without active tuberculosis: Secondary | ICD-10-CM

## 2020-05-28 IMAGING — CR PORTABLE CHEST - 1 VIEW
1 series · 1 of 1 positions shown · non-contrast
Comparison: 07/25/2010

CLINICAL DATA: Cough, fever

EXAM:
PORTABLE CHEST 1 VIEW

[AP]
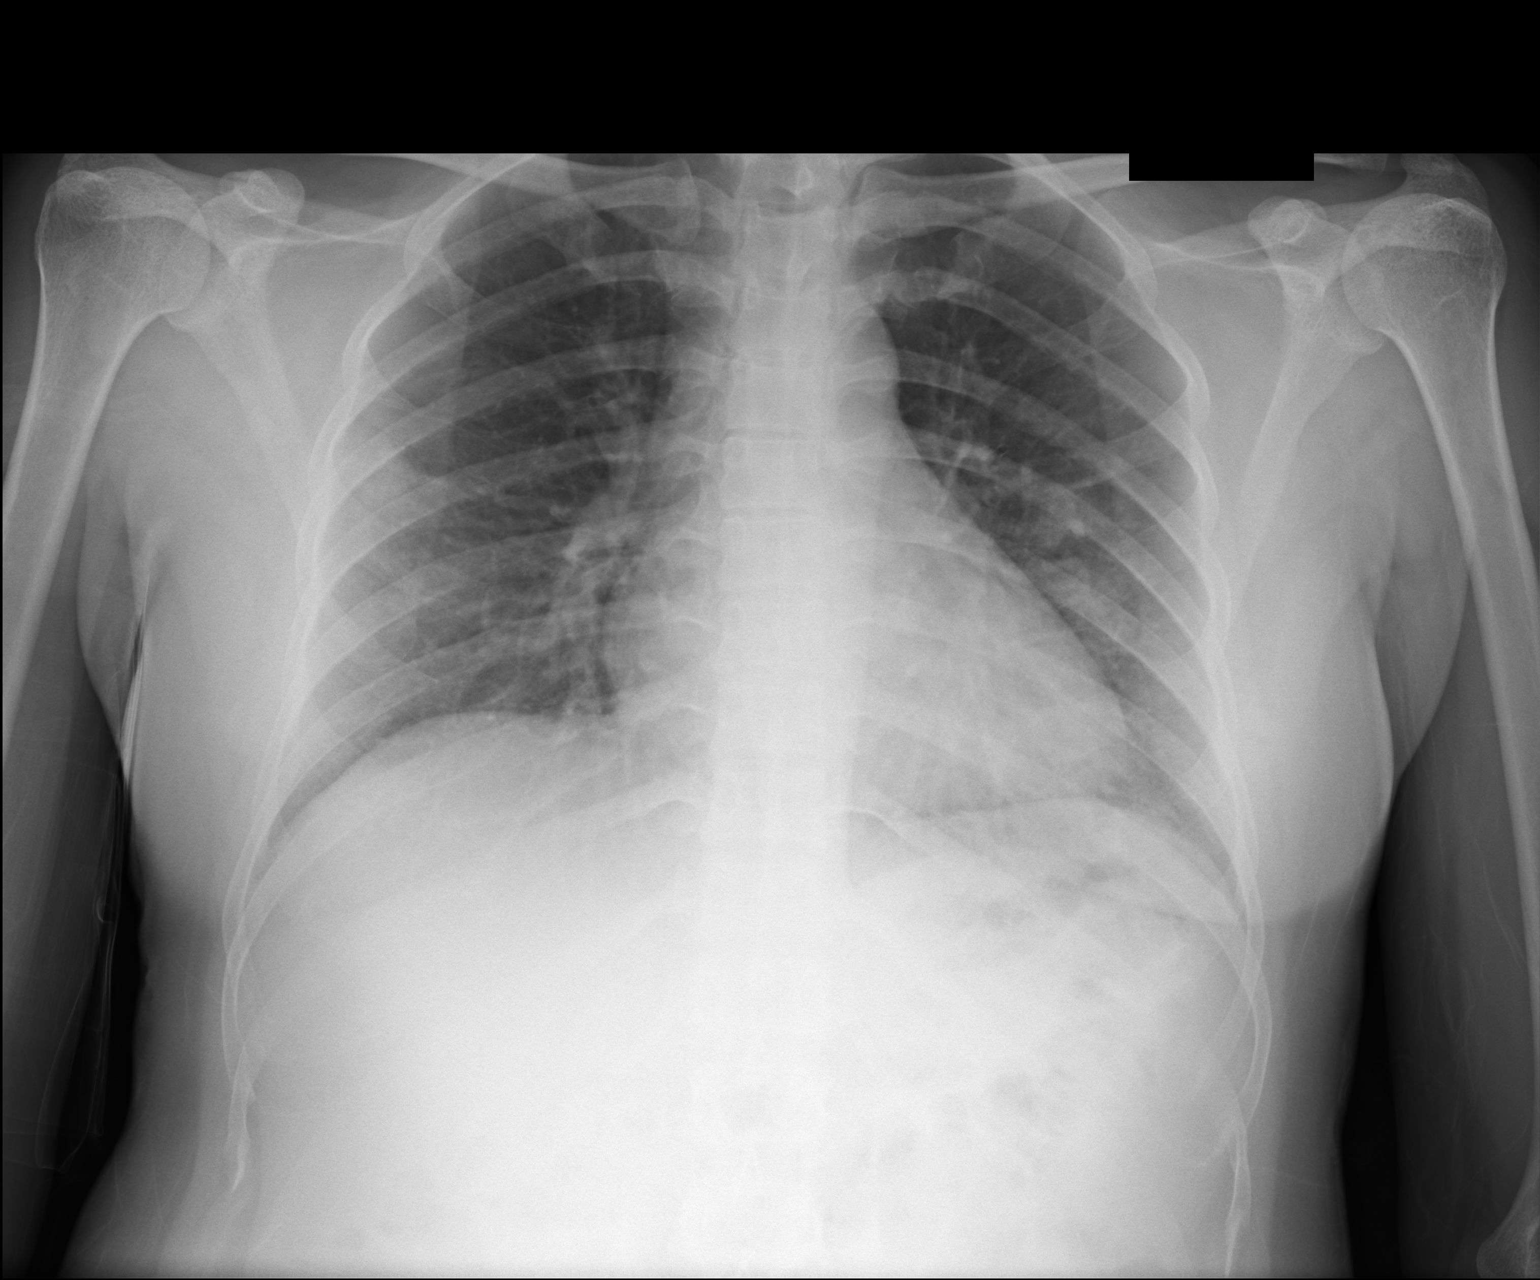

[1 of 1 positions shown; findings below may reference images not displayed]

FINDINGS: Mild lingular opacity. Mild patchy opacity in the lateral right mid
lung. These findings are suspicious for pneumonia. No pleural
effusion or pneumothorax.

The heart is normal in size.
IMPRESSION: Mild patchy bilateral pulmonary opacities, suspicious for multifocal
pneumonia.

## 2021-03-02 IMAGING — CR DG CHEST 2V
2 series · 2 of 2 positions shown · non-contrast
Comparison: 11/29/2018 and 07/25/2010

CLINICAL DATA: Positive PPD.

EXAM:
CHEST - 2 VIEW

[w chest pa]
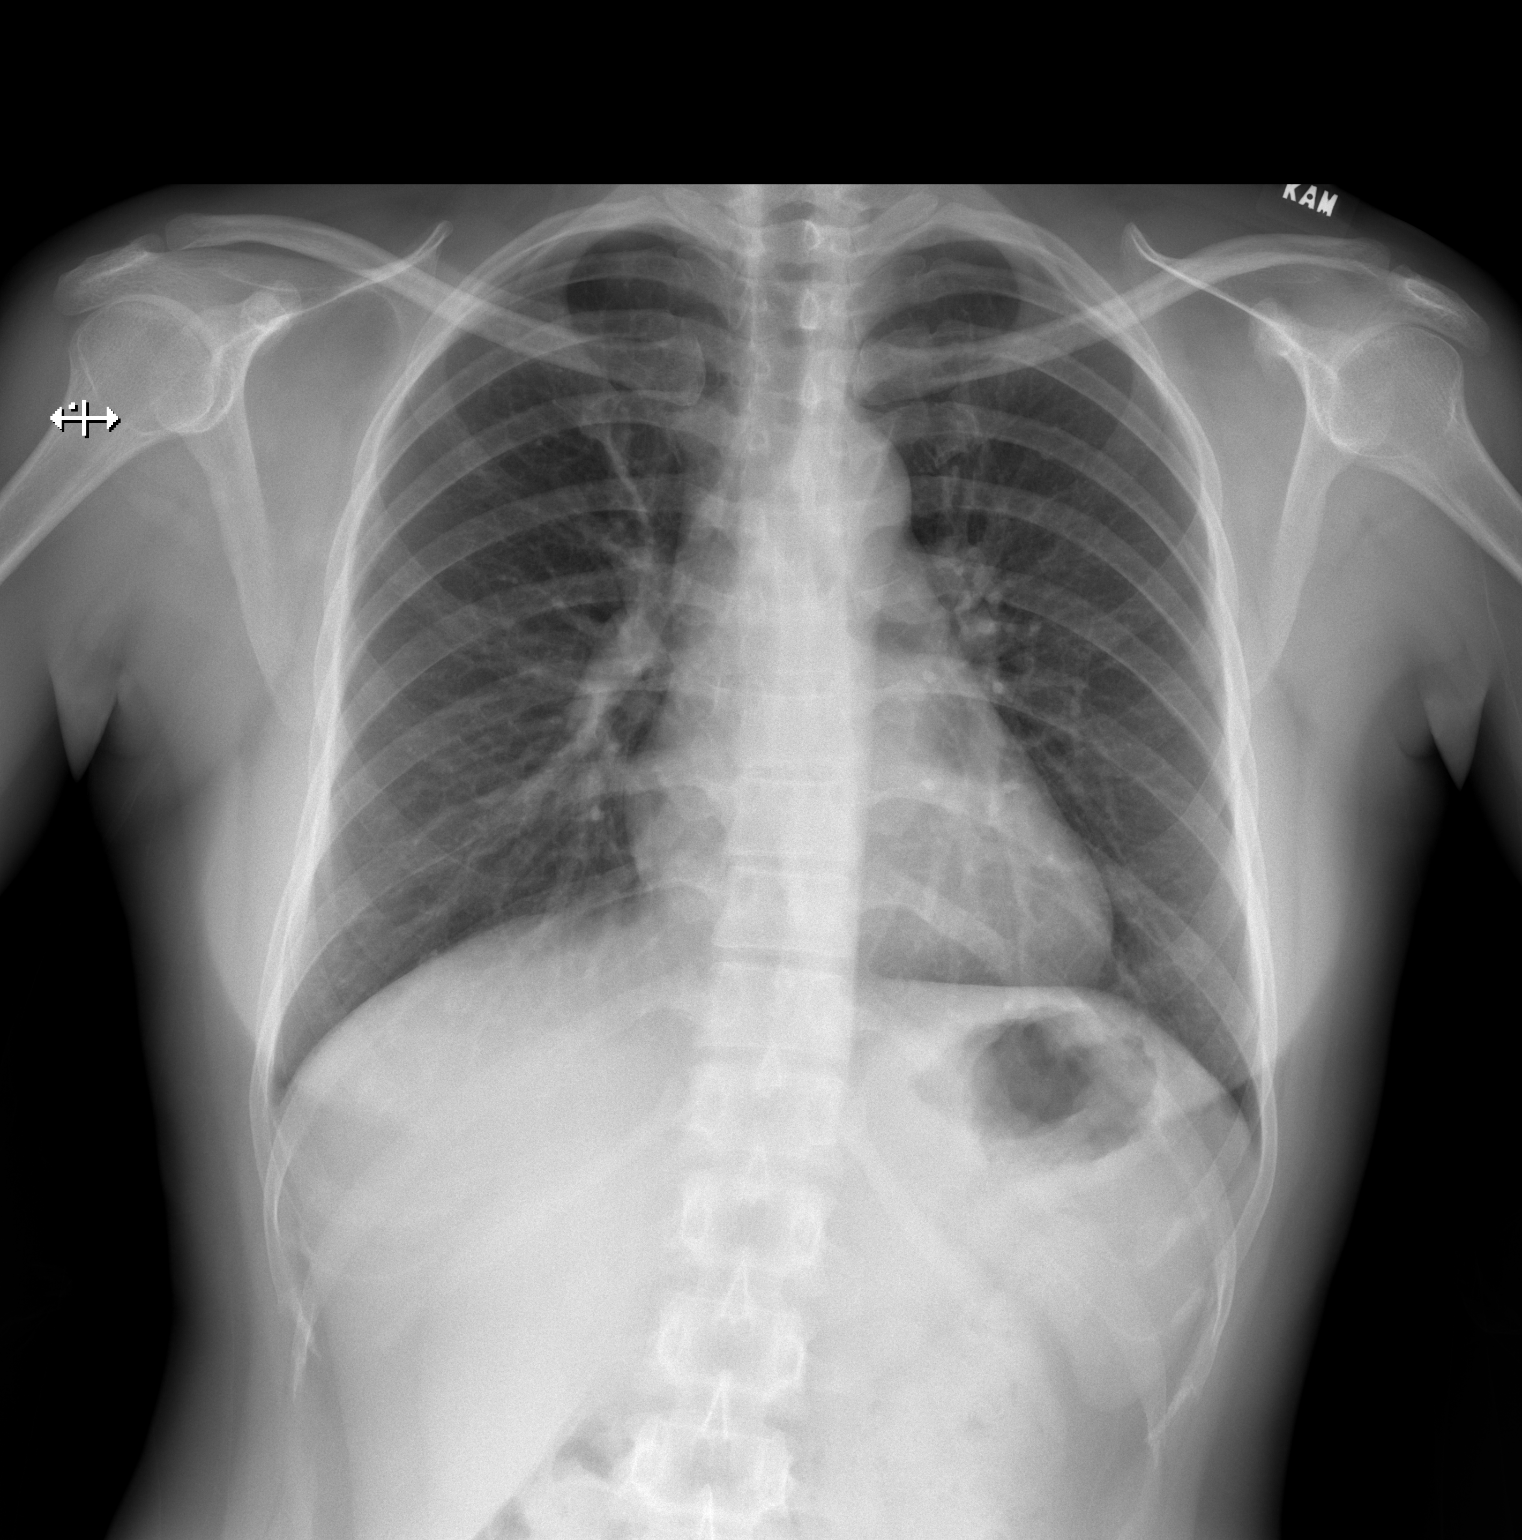

[w chest lat]
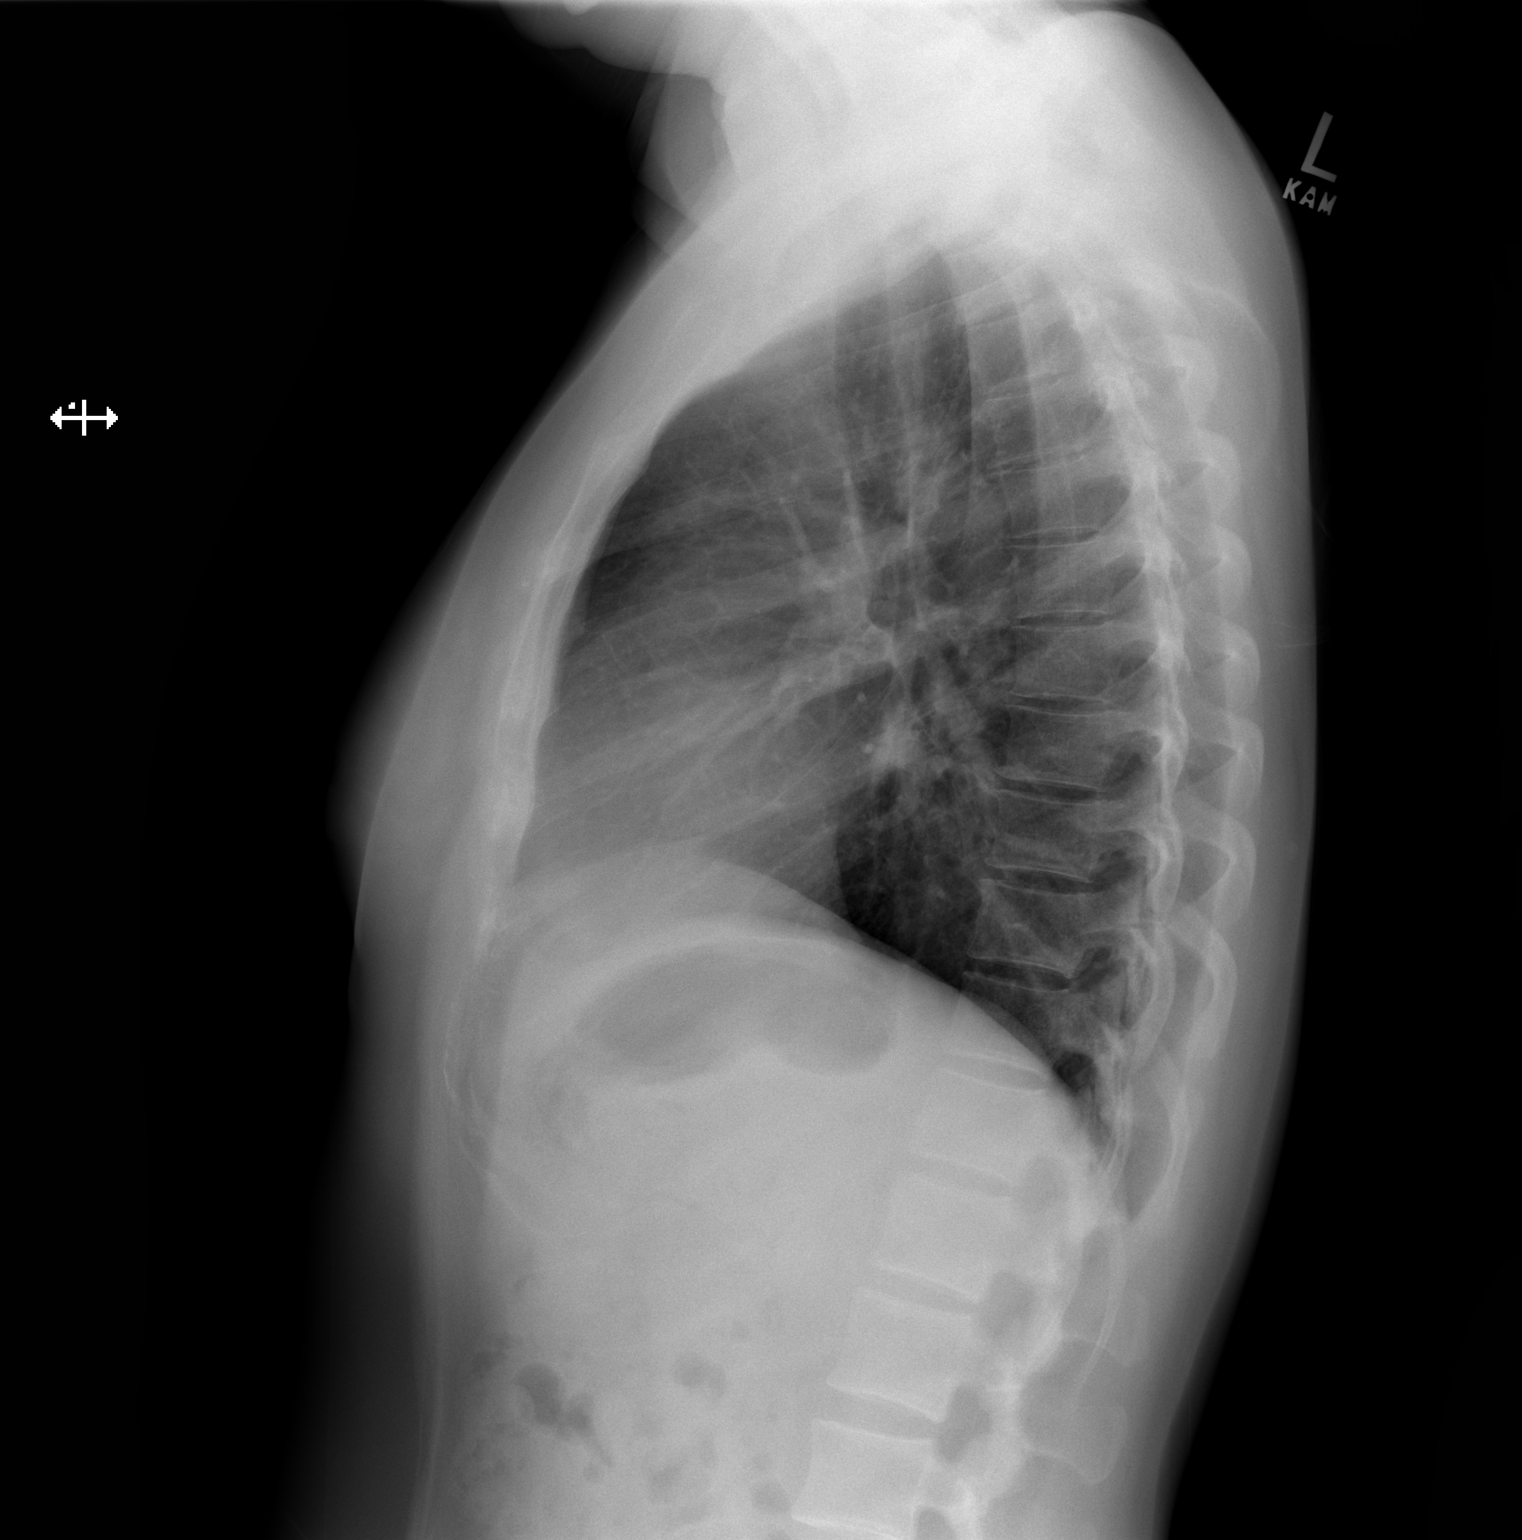

[2 of 2 positions shown; findings below may reference images not displayed]

FINDINGS: The heart size and mediastinal contours are within normal limits.
Both lungs are clear. The visualized skeletal structures are
unremarkable.
IMPRESSION: Normal exam.

## 2022-12-13 ENCOUNTER — Other Ambulatory Visit: Payer: Self-pay | Admitting: Internal Medicine

## 2022-12-13 DIAGNOSIS — Z1231 Encounter for screening mammogram for malignant neoplasm of breast: Secondary | ICD-10-CM

## 2022-12-20 ENCOUNTER — Ambulatory Visit
Admission: RE | Admit: 2022-12-20 | Discharge: 2022-12-20 | Disposition: A | Discharge status: Home / Self Care | Payer: PRIVATE HEALTH INSURANCE | Source: Ambulatory Visit | Attending: Internal Medicine | Admitting: Internal Medicine

## 2022-12-20 DIAGNOSIS — Z1231 Encounter for screening mammogram for malignant neoplasm of breast: Secondary | ICD-10-CM

## 2022-12-25 ENCOUNTER — Other Ambulatory Visit: Payer: Self-pay | Admitting: Internal Medicine

## 2022-12-25 DIAGNOSIS — R928 Other abnormal and inconclusive findings on diagnostic imaging of breast: Secondary | ICD-10-CM

## 2023-01-06 ENCOUNTER — Ambulatory Visit
Admission: RE | Admit: 2023-01-06 | Discharge: 2023-01-06 | Disposition: A | Discharge status: Home / Self Care | Payer: PRIVATE HEALTH INSURANCE | Source: Ambulatory Visit | Attending: Internal Medicine | Admitting: Internal Medicine

## 2023-01-06 DIAGNOSIS — R928 Other abnormal and inconclusive findings on diagnostic imaging of breast: Secondary | ICD-10-CM
